# Patient Record
Sex: Female | Born: 1960 | Race: Black or African American | Hispanic: No | Marital: Married | Smoking: Former smoker
Health system: Southern US, Community
[De-identification: ages and names within clinical notes are randomized; demographics above are authoritative.]

## PROBLEM LIST (undated history)

## (undated) DIAGNOSIS — I10 Essential (primary) hypertension: Secondary | ICD-10-CM

## (undated) HISTORY — PX: BUNIONECTOMY: SHX129

## (undated) HISTORY — PX: TUBAL LIGATION: SHX77

## (undated) HISTORY — PX: ABDOMINAL HYSTERECTOMY: SHX81

---

## 2006-08-27 ENCOUNTER — Other Ambulatory Visit: Admission: RE | Admit: 2006-08-27 | Discharge: 2006-08-27 | Payer: Self-pay | Admitting: Family Medicine

## 2007-11-18 ENCOUNTER — Ambulatory Visit (HOSPITAL_COMMUNITY): Admission: RE | Admit: 2007-11-18 | Discharge: 2007-11-19 | Payer: Self-pay | Admitting: Obstetrics and Gynecology

## 2007-11-18 ENCOUNTER — Encounter (INDEPENDENT_AMBULATORY_CARE_PROVIDER_SITE_OTHER): Payer: Self-pay | Admitting: Obstetrics and Gynecology

## 2010-09-05 NOTE — Op Note (Signed)
NAMECRYSTINA, Kara Braun               ACCOUNT NO.:  [id number]   MEDICAL RECORD NO.:  [id number]          PATIENT TYPE:  OIB   LOCATION:  9312                          FACILITY:  [facility]   PHYSICIAN:  Hal Morales, M.D.DATE OF BIRTH:  April 01, 1961   DATE OF PROCEDURE:  11/18/2007  DATE OF DISCHARGE:                               OPERATIVE REPORT   PREOPERATIVE DIAGNOSES:  1. Menorrhagia.  2. Uterine fibroids status post tubal ligation.   POSTOPERATIVE DIAGNOSES:  1. Menorrhagia.  2. Uterine fibroids status post tubal ligation.  3. Left hydrosalpinx.   OPERATIONS:  Total vaginal hysterectomy, right partial salpingectomy,  removal of left hydrosalpinx, and McCall culdoplasty.   SURGEON:  Vanessa P. Pennie Rushing, MD   FIRST ASSISTANT:  Janine Limbo, MD   ANESTHESIA:  General orotracheal.   ESTIMATED BLOOD LOSS:  125 mL.   COMPLICATIONS:  None.   FINDINGS:  The patient had a uterus that was approximately 8-week size  with small fibroids.  The right tube was status post tubal  sterilization.  The left tube was status post tubal sterilization but  enlarged to form a hydrosalpinx which was adherent to the left ovary.   PROCEDURE:  The patient was taken to the operating room after  appropriate identification and placed on the operating table.  After the  attainment of adequate general anesthesia, she was placed in the  lithotomy position.  The perineum and vagina were prepped with multiple  layers of Betadine and draped as a sterile field.  A Foley catheter was  inserted into the bladder and connected to straight drainage.  A  weighted speculum was placed in the posterior vagina and Lahey tenaculum  placed on the anterior and posterior surfaces of the cervix.  The  cervicovaginal mucosa was injected with a dilute solution of Pitressin  for 20 mL.  The cervix was circumscribed.  The anterior vaginal mucosa  was bluntly dissected off the anterior cervix and the anterior  peritoneum  entered.  The bladder blade was placed.  The posterior  peritoneum was entered sharply intact.  The uterosacral ligaments on the  right and left side were clamped, cut, and suture ligated with those  sutures being held.  The paracervical tissues were then clamped, cut,  and suture ligated.  Parametrial tissues were clamped, cut, and suture  ligated.  The uterus was inverted and the upper pedicles clamped, cut,  and doubly suture ligated.  The uterus and cervix were removed from the  operative field.  Hemostatic sutures were required near the upper  pedicles on the right and left side for adequate hemostasis.  The  fallopian tube on the right side was grasped with a Babcock clamp and  the mesosalpinx clamped, cut, and suture ligated allowing the portion of  right tube to be removed from the operative field.  The left tube had  formed a hydrosalpinx and was detached along its mesosalpinx with a  Kelly clamp and suture ligation up to the level of the ovary.  Its  connection with the ovary was then clamped, cut, and suture ligated.  The  sutures used on the tubes were 2-0 Vicryl.  All other sutures were 0  Vicryl.  Once hemostasis was noted to be adequate, the McCall  culdoplasty sutures were placed, three in succession by placing a suture  in the posterior cul-de-sac, then through the left uterosacral ligament  incorporating the intervening posterior peritoneum over to the right  uterosacral ligament and then ending in the posterior cul-de-sac through  to the posterior vagina.  Once the three culdoplasty sutures had been  placed, the vaginal angles were created with the sutures that were held  from the uterosacral ligaments placing them through the anterior vaginal  cuff and posterior vaginal cuff and then tying down each angle.  The  remainder of the vaginal cuff was closed in a single layer incorporating  anterior vaginal mucosa, anterior peritoneum, posterior peritoneum, and  posterior  vaginal mucosa in each figure-of-eight suture.  The McCall  culdoplasty sutures were then tied down.  The vagina was packed with 2-  inch plain packing which had been moistened with Estrace cream.  The  patient was then awakened from general anesthesia and taken to the  recovery room in satisfactory condition having tolerated the procedure  well with sponge and instrument counts correct.   SPECIMENS TO PATHOLOGY:  Uterus and cervix, portion of right tube, and  left hydrosalpinx.      Hal Morales, M.D.  Electronically Signed     VPH/MEDQ  D:  11/18/2007  T:  11/19/2007  Job:  [id number]

## 2010-09-08 NOTE — Discharge Summary (Signed)
Kara Braun, Kara Braun               ACCOUNT NO.:  [id number]   MEDICAL RECORD NO.:  [id number]          PATIENT TYPE:  OIB   LOCATION:  9312                          FACILITY:  [facility]   PHYSICIAN:  Hal Morales, M.D.DATE OF BIRTH:  February 12, 1961   DATE OF ADMISSION:  11/18/2007  DATE OF DISCHARGE:  11/19/2007                               DISCHARGE SUMMARY   DISCHARGE DIAGNOSES:  1. Menorrhagia.  2. Uterine fibroids.  3. Adenomyosis.  4. Endometriosis.  5. Hydrosalpinx.  6. Status post bilateral tubal ligation.   OPERATION ON THE DAY OF ADMISSION:  The patient underwent a total  vaginal hysterectomy with a partial right salpingectomy, removal of left  hydrosalpinx and McCall culdoplasty tolerating all procedures well.  The  patient was found to have an 8-week size uterus with small fibroid to be  status post bilateral tubal ligation with a left hydrosalpinx.   HISTORY OF PRESENT ILLNESS:  Kara Braun is a 50 year old married black  female para 2-0-1-1 who presents for definitive therapy of uterine  fibroids associated with menorrhagia and dysmenorrhea.  Please see the  patient's dictated history and physical examination for details.   PREOPERATIVE PHYSICAL EXAM:  VITAL SIGNS:  Blood pressure 120/80, pulse  is 70, height is 5 feet and 6-1/2 inches, and weight is 172.  GENERAL:  Within normal limits.  PELVIC:  EG/BUS was within normal limits.  Vagina was rugose.  Cervix  was without gross lesions.  The uterus was about 8-10 weeks' size,  mobile and nontender.  Adnexa was without masses.  Rectovaginal without  masses.   HOSPITAL COURSE:  On the day of admission, the patient underwent  aforementioned procedures tolerating them all well.  The patient's  postoperative course was marked by transient nausea, which resolved by  postop day #1 and along with the resumption of bowel and bladder  function.  The patient's postop hemoglobin was 11.7 (preop hemoglobin  11.9).  Having received  the maximum benefit of her hospital stay, the  patient was therefore discharged home on postop day #1.   DISCHARGE MEDICATIONS:  The patient was referred to her home medication  reconciliation form.  She was further prescribed Percocet 1-2 tablets  every 4 hours as needed for pain.  Ibuprofen 600 mg with food every 6  hours for 3 days, then as needed for pain.   FOLLOW UP:  The patient is to follow up with Dr. Pennie Rushing in 6 weeks as  scheduled for her postop visit.   DISCHARGE INSTRUCTIONS:  The patient was given a copy of [address]  [facility] postoperative instruction sheet.  She was further  advised to avoid driving for 2 weeks, heavy lifting for 4 weeks,  intercourse for 6 weeks, but she may walk up steps, that she may shower.  Diet was without restriction.   FINAL PATHOLOGY:  1. Uterus hysterectomy:  Benign cervix with squamous metaplasia,      benign proliferative endometrium, leiomyomata, and adenomyosis.  2. Bilateral fallopian tubes resection:  Salpingitis isthmica nodosa,      hydrosalpinx, and a small focus of endometriosis.  Kara Braun.      ______________________________  Hal Morales, M.D.    EJP/MEDQ  D:  12/03/2007  T:  12/04/2007  Job:  [id number]

## 2010-09-08 NOTE — H&P (Signed)
NAME:  Kara Braun, Kara Braun NO.:  [id number]   MEDICAL RECORD NO.:  [id number]        PATIENT TYPE:  WAMB   LOCATION:                                FACILITY:  [facility]   PHYSICIAN:  Hal Morales, M.D.DATE OF BIRTH:  Apr 21, 1961   DATE OF ADMISSION:  11/18/2007  DATE OF DISCHARGE:                              HISTORY & PHYSICAL   HISTORY OF PRESENT ILLNESS:  The patient is a 50 year old black married  female, para 2-0-1-1, who presents for definitive therapy of uterine  fibroids associated with menorrhagia and dysmenorrhea.  The patient has  a history of menorrhagia that dates back to 1999, but it has definitely  worsened over the subsequent years.  She now has menses that occur every  21 days lasting between 7 and 9 days.  She bleeds through her protection  approximately two nights per cycle and needs to change protection up to  four times during the night.  Cramps can start with the onset of  bleeding but some cycles she actually has no cramping.  Her last  menstrual period began on October 17, 2007.  Previous menstrual period had  been 21 days prior.  She denies any intermenstrual bleeding.   She has had anemia in the past but on her most recent blood work had a  normal hemoglobin.   PAST MEDICAL HISTORY:  GYN HISTORY:  She underwent menarche at age 46  and had menses every 21 days.  She currently uses tubal ligation for  contraception which was done in 1999.  She had her most recent Pap smear  on October 23, 2007.  She had an abnormal Pap smear many years ago but it  reverted to normal without therapy.  She does have fibroid tumors and  has known about those since her tubal ligation.   OBSTETRICAL HISTORY:  She has had two normal spontaneous vaginal  deliveries of approximately 6 pound infants.  Her son died at age 58.  She had one spontaneous abortion for which she had a D&C without  complications.   MEDICAL HISTORY:  Is significant for psoriatic migraine  headaches,  anemia, chronic hypertension and recently diagnosed asthma with severe  allergies.   FAMILY HISTORY:  Is significant for chronic hypertension and heart  disease.   SURGICAL HISTORY:  The patient has undergone two bunionectomies and a  tubal ligation, all under general anesthesia without difficulty.   CURRENT MEDICATIONS:  1. Atenolol 100 mg a day.  2. Micardis 40 mg a day.  3. Astelin nasal spray.  4. Xopenex inhaler.  5. Singulair 4 mg a day.  6. Symbicort inhaler.  7. Allergy shots weekly.  8. Vitamins and calcium with vitamin D.   DRUG SENSITIVITIES:  PENICILLIN causes swelling   SOCIAL HISTORY:  The patient is married and works as a patient Investment banker, operational.  She drinks two to three glasses of wine per week.  She  does not smoke cigarettes.   LABORATORY STUDIES:  She has undergone endometrial biopsy and pelvic  ultrasound, the results of which are pending at the time of this  dictation.   REVIEW OF SYSTEMS:  Is significant for severe allergic reactive airway  disease with multiple allergies.   PHYSICAL EXAMINATION:  The patient is a well-developed black female in  no acute distress.  Blood pressure is 120/80, pulse is 70, height 5 feet  6-1/2 inches, weight 172 pounds.  LUNGS:  Clear.  HEART: Regular rate and rhythm.  Thyroid normal.  ABDOMEN: Abdomen is benign without masses or organomegaly.  EXTREMITIES: No clubbing, cyanosis or edema.  PELVIC:  EG/BUS within normal limits.  The vagina is rugous.  The cervix  is without gross lesions.  The uterus is about 8 to 10-week size, mobile  and nontender.  Adnexae no masses.  Rectovaginal no masses.   IMPRESSION:  1. Uterine fibroids.  2. Dysmenorrhea.  3. Menorrhagia.  4. Reactive airway disease.  5. Hypertension, well controlled.   DISPOSITION:  Discussions have been held with the patient concerning  options for management of her fibroids associated with dysmenorrhea and  menorrhagia.  The  patient has decided she wants to proceed with  definitive therapy of hysterectomy.  The risks of anesthesia,  particularly in the presence of asthma, bleeding, infection and damage  to adjacent organs as well as the possible need for transfusion have all  been discussed with the patient.  She wishes to proceed.  This will be  done at [facility] on November 18, 2007.      Hal Morales, M.D.  Electronically Signed     VPH/MEDQ  D:  10/23/2007  T:  10/23/2007  Job:  [id number]

## 2010-09-15 ENCOUNTER — Other Ambulatory Visit: Payer: Self-pay | Admitting: Family Medicine

## 2010-09-15 ENCOUNTER — Other Ambulatory Visit (HOSPITAL_COMMUNITY)
Admission: RE | Admit: 2010-09-15 | Discharge: 2010-09-15 | Disposition: A | Discharge status: Home / Self Care | Payer: BC Managed Care – PPO | Source: Ambulatory Visit | Attending: Family Medicine | Admitting: Family Medicine

## 2010-09-15 DIAGNOSIS — Z124 Encounter for screening for malignant neoplasm of cervix: Secondary | ICD-10-CM | POA: Insufficient documentation

## 2010-09-15 DIAGNOSIS — Z1159 Encounter for screening for other viral diseases: Secondary | ICD-10-CM | POA: Insufficient documentation

## 2011-01-19 LAB — CBC
HCT: 35.5 — ABNORMAL LOW
HCT: 36.1
Hemoglobin: 11.7 — ABNORMAL LOW
Hemoglobin: 11.9 — ABNORMAL LOW
MCHC: 32.8
MCHC: 33.1
MCV: 91.4
MCV: 92.7
Platelets: 340
Platelets: 391
RBC: 3.83 — ABNORMAL LOW
RBC: 3.95
RDW: 13.8
RDW: 14.1
WBC: 13.9 — ABNORMAL HIGH
WBC: 6.2

## 2011-01-19 LAB — BASIC METABOLIC PANEL
BUN: 5 — ABNORMAL LOW
CO2: 30
Calcium: 9.1
Chloride: 102
Creatinine, Ser: 0.73
GFR calc Af Amer: >60
GFR calc non Af Amer: >60
Glucose, Bld: 112 — ABNORMAL HIGH
Potassium: 4.2
Sodium: 136

## 2011-06-15 ENCOUNTER — Other Ambulatory Visit: Payer: Self-pay | Admitting: Family Medicine

## 2011-06-15 ENCOUNTER — Ambulatory Visit
Admission: RE | Admit: 2011-06-15 | Discharge: 2011-06-15 | Disposition: A | Discharge status: Home / Self Care | Payer: BC Managed Care – PPO | Source: Ambulatory Visit | Attending: Family Medicine | Admitting: Family Medicine

## 2011-06-15 DIAGNOSIS — M545 Low back pain, unspecified: Secondary | ICD-10-CM

## 2012-03-05 ENCOUNTER — Other Ambulatory Visit: Payer: Self-pay | Admitting: *Deleted

## 2012-03-05 DIAGNOSIS — I83893 Varicose veins of bilateral lower extremities with other complications: Secondary | ICD-10-CM

## 2012-03-10 ENCOUNTER — Encounter: Payer: Self-pay | Admitting: Vascular Surgery

## 2012-03-11 ENCOUNTER — Encounter: Payer: Self-pay | Admitting: Vascular Surgery

## 2012-03-11 ENCOUNTER — Ambulatory Visit (INDEPENDENT_AMBULATORY_CARE_PROVIDER_SITE_OTHER): Payer: BC Managed Care – PPO | Admitting: Vascular Surgery

## 2012-03-11 VITALS — BP 151/84 | HR 60 | Resp 16 | Ht 66.0 in | Wt 180.0 lb

## 2012-03-11 DIAGNOSIS — I8390 Asymptomatic varicose veins of unspecified lower extremity: Secondary | ICD-10-CM | POA: Insufficient documentation

## 2012-03-11 DIAGNOSIS — I83893 Varicose veins of bilateral lower extremities with other complications: Secondary | ICD-10-CM

## 2012-03-11 DIAGNOSIS — M7989 Other specified soft tissue disorders: Secondary | ICD-10-CM

## 2012-03-11 DIAGNOSIS — R609 Edema, unspecified: Secondary | ICD-10-CM

## 2012-03-11 NOTE — Progress Notes (Signed)
Subjective:     Patient ID: Kara Braun, female   DOB: 23-Nov-1960, 51 y.o.   MRN: [id number]  HPI this 51 year-old female is evaluated for bilateral lower extremity edema present since July 2013. She has no history of trauma, DVT, superficial thrombophlebitis, Ultram varicose veins, stasis ulcers, or bleeding. She is not wear elastic compression stockings. She does take a diuretic-Lasix with her antihypertensive regimen. She does not elevate the legs. They become swollen as the day progresses and cause some aching discomfort.  History reviewed. No pertinent past medical history.  History  Substance Use Topics  . Smoking status: Former Smoker    Quit date: 05/12/1995  . Smokeless tobacco: Not on file  . Alcohol Use: 1.8 oz/week    3 Glasses of wine per week    Family History  Problem Relation Age of Onset  . Hypertension Mother   . Hypertension Father   . Heart disease Father   . Hypertension Sister   . Hypertension Brother     Allergies  Allergen Reactions  . Penicillins     Current outpatient prescriptions:azelastine (ASTELIN) 137 MCG/SPRAY nasal spray, Place 1 spray into the nose 2 (two) times daily. Use in each nostril as directed, Disp: , Rfl: ;  cetirizine (ZYRTEC) 10 MG tablet, Take 10 mg by mouth daily., Disp: , Rfl: ;  furosemide (LASIX) 20 MG tablet, Take 20 mg by mouth 2 (two) times daily., Disp: , Rfl: ;  traMADol (ULTRAM) 50 MG tablet, Take 50 mg by mouth every 6 (six) hours as needed., Disp: , Rfl:  ALPRAZolam (XANAX) 0.25 MG tablet, , Disp: , Rfl: ;  atenolol (TENORMIN) 100 MG tablet, , Disp: , Rfl: ;  losartan (COZAAR) 50 MG tablet, , Disp: , Rfl:   BP 151/84  Pulse 60  Resp 16  Ht 5\' 6"  (1.676 m)  Wt 180 lb (81.647 kg)  BMI 29.05 kg/m2  Body mass index is 29.05 kg/(m^2).          Review of Systems denies chest pain, dyspnea on exertion, PND, orthopnea, hemoptysis, lateralizing weakness, aphasia, amaurosis fugax, syncope.    Objective:   Physical  Exam blood pressure 151/84 heart rate 60 respirations 16 Gen.-alert and oriented x3 in no apparent distress HEENT normal for age Lungs no rhonchi or wheezing Cardiovascular regular rhythm no murmurs carotid pulses 3+ palpable no bruits audible Abdomen soft nontender no palpable masses Musculoskeletal free of  major deformities Skin clear -no rashes Neurologic normal Lower extremities 3+ femoral and dorsalis pedis pulses palpable bilaterally with no edema presently Patch of spider and small reticular veins anterolateral thigh the left leg over about a 3 cm in diameter area. No hyperpigmentation or ulceration noted.  Today I ordered bilateral venous duplex exam which I reviewed and interpreted. The right leg is totally normal. Left leg has a few areas of mild reflux in the distal left thigh and some reflux in the deep system but no DVT.      Assessment:     Bilateral lower extremity edema-etiology unknown No obvious venous source Mild reflux areas of left superficial venous system    Plan:     Recommend short leg elastic compression stockings during the day and elevation of legs at night and continue diuretic therapy

## 2012-03-11 NOTE — Progress Notes (Signed)
Bilateral lower extremity venous duplex performed @ [facility]11/19/2013

## 2012-03-14 ENCOUNTER — Encounter (INDEPENDENT_AMBULATORY_CARE_PROVIDER_SITE_OTHER): Payer: BC Managed Care – PPO

## 2012-03-14 DIAGNOSIS — I83893 Varicose veins of bilateral lower extremities with other complications: Secondary | ICD-10-CM

## 2015-05-23 ENCOUNTER — Other Ambulatory Visit: Payer: Self-pay | Admitting: Radiology

## 2017-03-08 ENCOUNTER — Other Ambulatory Visit: Payer: Self-pay | Admitting: Family Medicine

## 2017-03-08 DIAGNOSIS — R222 Localized swelling, mass and lump, trunk: Secondary | ICD-10-CM

## 2017-03-18 ENCOUNTER — Other Ambulatory Visit: Payer: Self-pay

## 2017-03-19 ENCOUNTER — Ambulatory Visit
Admission: RE | Admit: 2017-03-19 | Discharge: 2017-03-19 | Disposition: A | Discharge status: Home / Self Care | Payer: 59 | Source: Ambulatory Visit | Attending: Family Medicine | Admitting: Family Medicine

## 2017-03-19 DIAGNOSIS — R222 Localized swelling, mass and lump, trunk: Secondary | ICD-10-CM

## 2017-03-26 ENCOUNTER — Other Ambulatory Visit: Payer: Self-pay

## 2017-08-21 ENCOUNTER — Other Ambulatory Visit: Payer: Self-pay | Admitting: Obstetrics and Gynecology

## 2017-08-21 DIAGNOSIS — R1031 Right lower quadrant pain: Secondary | ICD-10-CM

## 2017-08-22 ENCOUNTER — Other Ambulatory Visit: Payer: Self-pay | Admitting: Obstetrics and Gynecology

## 2017-08-22 ENCOUNTER — Ambulatory Visit
Admission: RE | Admit: 2017-08-22 | Discharge: 2017-08-22 | Disposition: A | Discharge status: Home / Self Care | Payer: 59 | Source: Ambulatory Visit | Attending: Obstetrics and Gynecology | Admitting: Obstetrics and Gynecology

## 2017-08-22 DIAGNOSIS — R1031 Right lower quadrant pain: Secondary | ICD-10-CM

## 2017-08-30 ENCOUNTER — Other Ambulatory Visit: Payer: Self-pay | Admitting: Obstetrics and Gynecology

## 2017-08-30 DIAGNOSIS — R1031 Right lower quadrant pain: Secondary | ICD-10-CM

## 2017-10-21 ENCOUNTER — Other Ambulatory Visit: Payer: Self-pay

## 2017-10-21 ENCOUNTER — Emergency Department (HOSPITAL_COMMUNITY)
Admission: EM | Admit: 2017-10-21 | Discharge: 2017-10-21 | Disposition: A | Discharge status: Home / Self Care | Payer: 59 | Attending: Emergency Medicine | Admitting: Emergency Medicine

## 2017-10-21 ENCOUNTER — Emergency Department (HOSPITAL_COMMUNITY): Payer: 59

## 2017-10-21 ENCOUNTER — Encounter (HOSPITAL_COMMUNITY): Payer: Self-pay | Admitting: Emergency Medicine

## 2017-10-21 DIAGNOSIS — I10 Essential (primary) hypertension: Secondary | ICD-10-CM | POA: Diagnosis not present

## 2017-10-21 DIAGNOSIS — Z87891 Personal history of nicotine dependence: Secondary | ICD-10-CM | POA: Insufficient documentation

## 2017-10-21 DIAGNOSIS — Z79899 Other long term (current) drug therapy: Secondary | ICD-10-CM | POA: Insufficient documentation

## 2017-10-21 HISTORY — DX: Essential (primary) hypertension: I10

## 2017-10-21 LAB — CBC WITH DIFFERENTIAL/PLATELET
Basophils Absolute: 0.1 10*3/uL (ref 0.0–0.1)
Basophils Relative: 2 %
Eosinophils Absolute: 0.3 10*3/uL (ref 0.0–0.7)
Eosinophils Relative: 5 %
HCT: 39.6 % (ref 36.0–46.0)
Hemoglobin: 13.1 g/dL (ref 12.0–15.0)
Lymphocytes Relative: 50 %
Lymphs Abs: 3.4 10*3/uL (ref 0.7–4.0)
MCH: 29.8 pg (ref 26.0–34.0)
MCHC: 33.1 g/dL (ref 30.0–36.0)
MCV: 90 fL (ref 78.0–100.0)
Monocytes Absolute: 0.5 10*3/uL (ref 0.1–1.0)
Monocytes Relative: 7 %
Neutro Abs: 2.4 10*3/uL (ref 1.7–7.7)
Neutrophils Relative %: 36 %
Platelets: 359 10*3/uL (ref 150–400)
RBC: 4.4 MIL/uL (ref 3.87–5.11)
RDW: 13.6 % (ref 11.5–15.5)
WBC: 6.7 10*3/uL (ref 4.0–10.5)

## 2017-10-21 LAB — BASIC METABOLIC PANEL
Anion gap: 7 (ref 5–15)
BUN: 16 mg/dL (ref 6–20)
CO2: 27 mmol/L (ref 22–32)
Calcium: 9.3 mg/dL (ref 8.9–10.3)
Chloride: 103 mmol/L (ref 98–111)
Creatinine, Ser: 0.77 mg/dL (ref 0.44–1.00)
GFR calc Af Amer: >60 mL/min (ref >60)
GFR calc non Af Amer: >60 mL/min (ref >60)
Glucose, Bld: 99 mg/dL (ref 70–99)
Potassium: 3.7 mmol/L (ref 3.5–5.1)
Sodium: 137 mmol/L (ref 135–145)

## 2017-10-21 LAB — URINALYSIS, ROUTINE W REFLEX MICROSCOPIC
Bilirubin Urine: NEGATIVE
Glucose, UA: NEGATIVE mg/dL
Hgb urine dipstick: NEGATIVE
Ketones, ur: NEGATIVE mg/dL
Leukocytes, UA: NEGATIVE
Nitrite: NEGATIVE
Protein, ur: NEGATIVE mg/dL
Specific Gravity, Urine: 1.006 (ref 1.005–1.030)
pH: 5 (ref 5.0–8.0)

## 2017-10-21 LAB — I-STAT TROPONIN, ED: Troponin i, poc: 0 ng/mL (ref 0.00–0.08)

## 2017-10-21 NOTE — ED Notes (Signed)
ED Provider at bedside. 

## 2017-10-21 NOTE — ED Notes (Signed)
EKG given to EDP,Nanavati,MD., for review. 

## 2017-10-21 NOTE — Discharge Instructions (Signed)
Your blood pressure is elevated today but not critically high. Check BP twice daily at home, preferably same time each day.  Keep a log for your primary care doctor. Call in the morning to arrange follow-up for re-check. Return here for any new/acute changes.

## 2017-10-21 NOTE — ED Provider Notes (Signed)
[facility]-EMERGENCY DEPT Provider Note   CSN: [id number] Arrival date & time: 10/21/17  0009     History   Chief Complaint Chief Complaint  Patient presents with  . Hypertension    HPI Kara Braun is a 57 y.o. female.  The history is provided by the patient and medical records.    57 year old female with history of hypertension, presenting to the ED with elevated blood pressures.  Patient reports she has noticed today that her blood pressure has been more elevated than normal.  States on Friday she had a little bit of a headache but did not check her blood pressure at that time.  States yesterday she felt fine, but today started having a little bit of a headache again.  States she has checked her blood pressure multiple times today, usually in the 160s to 170s systolic.  States her baseline blood pressure is around 120s/80s.  She has not had any changes in her blood pressure medicine.  She has not had any recent illness.  Has not been taking any over-the-counter cough or cold medications.  She denies any significant salt intake.  No new added stress.  Does report today she felt a little bit "off" and had some transient tingling in her left arm.  She denies any chest pain, shortness of breath, diaphoresis, nausea, or vomiting.  Has been able to eat and drink well today.  States she has been on the same blood pressure medications for several years, no adjustments to her dosages.  No known cardiac history.  She is not a smoker.  Past Medical History:  Diagnosis Date  . Hypertension     Patient Active Problem List   Diagnosis Date Noted  . Varicose veins 03/11/2012  . Swelling of limb 03/11/2012    Past Surgical History:  Procedure Laterality Date  . ABDOMINAL HYSTERECTOMY    . BUNIONECTOMY    . TUBAL LIGATION       OB History   None      Home Medications    Prior to Admission medications   Medication Sig Start Date End Date Taking? Authorizing  Provider  ALPRAZolam Prudy Feeler) 0.25 MG tablet  03/04/12   [provider]  atenolol (TENORMIN) 100 MG tablet  01/11/12   [provider]  azelastine (ASTELIN) 137 MCG/SPRAY nasal spray Place 1 spray into the nose 2 (two) times daily. Use in each nostril as directed    [provider]  cetirizine (ZYRTEC) 10 MG tablet Take 10 mg by mouth daily.    [provider]  furosemide (LASIX) 20 MG tablet Take 20 mg by mouth 2 (two) times daily.    [provider]  losartan (COZAAR) 50 MG tablet  03/08/12   [provider]  traMADol (ULTRAM) 50 MG tablet Take 50 mg by mouth every 6 (six) hours as needed.    [provider]    Family History Family History  Problem Relation Age of Onset  . Hypertension Mother   . Hypertension Father   . Heart disease Father   . Hypertension Sister   . Hypertension Brother     Social History Social History   Tobacco Use  . Smoking status: Former Smoker    Last attempt to quit: 05/12/1995    Years since quitting: 22.4  . Smokeless tobacco: Never Used  Substance Use Topics  . Alcohol use: Yes    Alcohol/week: 1.8 oz    Types: 3 Glasses of wine per  week  . Drug use: No     Allergies   Penicillins   Review of Systems Review of Systems  Constitutional:       Elevated BP  All other systems reviewed and are negative.    Physical Exam Updated Vital Signs BP (!) 174/89 (BP Location: Right Arm)   Pulse (!) 58   Temp 97.7 F (36.5 C) (Oral)   Resp 18   Ht 5\' 6"  (1.676 m)   Wt 75.3 kg (166 lb)   SpO2 100%   BMI 26.79 kg/m   Physical Exam  Constitutional: She is oriented to person, place, and time. She appears well-developed and well-nourished. No distress.  HENT:  Head: Normocephalic and atraumatic.  Right Ear: External ear normal.  Left Ear: External ear normal.  Mouth/Throat: Oropharynx is clear and moist.  Eyes: Pupils are equal, round, and reactive to light. Conjunctivae and EOM  are normal.  Neck: Normal range of motion and full passive range of motion without pain. Neck supple. No neck rigidity.  No rigidity, no meningismus  Cardiovascular: Normal rate, regular rhythm and normal heart sounds.  No murmur heard. Pulmonary/Chest: Effort normal and breath sounds normal. No respiratory distress. She has no wheezes. She has no rhonchi.  Abdominal: Soft. Bowel sounds are normal. There is no tenderness. There is no guarding.  Musculoskeletal: Normal range of motion. She exhibits no edema.  Neurological: She is alert and oriented to person, place, and time. She has normal strength. She displays no tremor. No cranial nerve deficit or sensory deficit. She displays no seizure activity.  AAOx3, answering questions and following commands appropriately; equal strength UE and LE bilaterally; CN grossly intact; moves all extremities appropriately without ataxia; no focal neuro deficits or facial asymmetry appreciated  Skin: Skin is warm and dry. No rash noted. She is not diaphoretic.  Psychiatric: She has a normal mood and affect. Her behavior is normal. Thought content normal.  Nursing note and vitals reviewed.    ED Treatments / Results  Labs (all labs ordered are listed, but only abnormal results are displayed) Labs Reviewed  URINALYSIS, ROUTINE W REFLEX MICROSCOPIC - Abnormal; Notable for the following components:      Result Value   Color, Urine STRAW (*)    All other components within normal limits  CBC WITH DIFFERENTIAL/PLATELET  BASIC METABOLIC PANEL  I-STAT TROPONIN, ED    EKG EKG Interpretation  Date/Time:  Monday October 21 2017 02:56:05 EDT Ventricular Rate:  47 PR Interval:    QRS Duration: 81 QT Interval:  439 QTC Calculation: 389 R Axis:   9 Text Interpretation:  Sinus bradycardia Probable left atrial enlargement Anteroseptal infarct, age indeterminate No acute changes Confirmed by Derwood Kaplananavati, Ankit [phone]([id number]) on 10/21/2017 4:33:56 AM   Radiology Dg Chest 2  View  Result Date: 10/21/2017 CLINICAL DATA:  57 year old female with hypertension. EXAM: CHEST - 2 VIEW COMPARISON:  Chest radiograph dated 10/30/2006 FINDINGS: Mild emphysema and chronic bronchitic changes. No focal consolidation, pleural effusion, or pneumothorax. The cardiac silhouette is within normal limits. No acute osseous pathology. IMPRESSION: No active cardiopulmonary disease. Electronically Signed   By: Elgie CollardArash  Radparvar M.D.   On: 10/21/2017 03:35    Procedures Procedures (including critical care time)  Medications Ordered in ED Medications - No data to display   Initial Impression / Assessment and Plan / ED Course  I have reviewed the triage vital signs and the nursing notes.  Pertinent labs & imaging results that were available during my care  of the patient were reviewed by me and considered in my medical decision making (see chart for details).  57 year old female presenting to the ED with elevated blood pressure.  States she has had a little bit of a headache and transient "tingling", both of which resolved at this time.  No chest pain, SOB.  Blood pressures here in the 170s systolic, states he is usually in the 120 systolic.  Has been compliant with her medications.  No new stress, over-the-counter cough/cold medications, or other issues to cause elevated BP.  She is awake, alert, appropriately oriented.  She has no focal neurologic deficits.  No complaint of chest pain or shortness of breath.  EKG is nonischemic.  Labs and troponin sent which were all reassuring.  Blood pressure has started to decrease here without any acute intervention, now 153/85.  She has no signs of endorgan damage.  We will have her keep a log of her blood pressures at home over the next few days, check twice daily at the same time each day.  Recommended close follow-up with PCP for recheck of her blood pressure.  She understands to return here for any new or acute changes.  Final Clinical Impressions(s) /  ED Diagnoses   Final diagnoses:  Essential hypertension    ED Discharge Orders    None       Garlon Hatchet, PA-C 10/21/17 [id number]    Derwood Kaplan, MD 10/29/17 1705

## 2017-10-21 NOTE — ED Triage Notes (Signed)
Pt reports having elevated blood pressure that has not decreased with blood pressure medications.

## 2018-06-08 ENCOUNTER — Other Ambulatory Visit: Payer: Self-pay

## 2018-06-08 ENCOUNTER — Observation Stay (HOSPITAL_COMMUNITY)
Admission: EM | Admit: 2018-06-08 | Discharge: 2018-06-09 | Disposition: A | Discharge status: Home / Self Care | Payer: 59 | Attending: Surgery | Admitting: Surgery

## 2018-06-08 ENCOUNTER — Emergency Department (HOSPITAL_COMMUNITY): Payer: 59

## 2018-06-08 ENCOUNTER — Encounter (HOSPITAL_COMMUNITY): Payer: Self-pay | Admitting: Emergency Medicine

## 2018-06-08 DIAGNOSIS — K439 Ventral hernia without obstruction or gangrene: Secondary | ICD-10-CM | POA: Diagnosis not present

## 2018-06-08 DIAGNOSIS — I10 Essential (primary) hypertension: Secondary | ICD-10-CM | POA: Diagnosis not present

## 2018-06-08 DIAGNOSIS — Z880 Allergy status to penicillin: Secondary | ICD-10-CM | POA: Insufficient documentation

## 2018-06-08 DIAGNOSIS — Z79899 Other long term (current) drug therapy: Secondary | ICD-10-CM | POA: Diagnosis not present

## 2018-06-08 DIAGNOSIS — Z791 Long term (current) use of non-steroidal anti-inflammatories (NSAID): Secondary | ICD-10-CM | POA: Insufficient documentation

## 2018-06-08 DIAGNOSIS — R1033 Periumbilical pain: Secondary | ICD-10-CM | POA: Diagnosis present

## 2018-06-08 DIAGNOSIS — Z87891 Personal history of nicotine dependence: Secondary | ICD-10-CM | POA: Insufficient documentation

## 2018-06-08 LAB — CBC WITH DIFFERENTIAL/PLATELET
Abs Immature Granulocytes: 0 10*3/uL (ref 0.00–0.07)
Basophils Absolute: 0.1 10*3/uL (ref 0.0–0.1)
Basophils Relative: 2 %
Eosinophils Absolute: 0.1 10*3/uL (ref 0.0–0.5)
Eosinophils Relative: 3 %
HCT: 41.7 % (ref 36.0–46.0)
Hemoglobin: 13.1 g/dL (ref 12.0–15.0)
Immature Granulocytes: 0 %
Lymphocytes Relative: 33 %
Lymphs Abs: 1.4 10*3/uL (ref 0.7–4.0)
MCH: 28.7 pg (ref 26.0–34.0)
MCHC: 31.4 g/dL (ref 30.0–36.0)
MCV: 91.2 fL (ref 80.0–100.0)
Monocytes Absolute: 0.6 10*3/uL (ref 0.1–1.0)
Monocytes Relative: 15 %
Neutro Abs: 2 10*3/uL (ref 1.7–7.7)
Neutrophils Relative %: 47 %
Platelets: 331 10*3/uL (ref 150–400)
RBC: 4.57 MIL/uL (ref 3.87–5.11)
RDW: 13.7 % (ref 11.5–15.5)
WBC: 4.2 10*3/uL (ref 4.0–10.5)
nRBC: 0 % (ref 0.0–0.2)

## 2018-06-08 LAB — COMPREHENSIVE METABOLIC PANEL
ALT: 22 U/L (ref 0–44)
AST: 23 U/L (ref 15–41)
Albumin: 3.7 g/dL (ref 3.5–5.0)
Alkaline Phosphatase: 75 U/L (ref 38–126)
Anion gap: 6 (ref 5–15)
BUN: <5 mg/dL — ABNORMAL LOW (ref 6–20)
CO2: 26 mmol/L (ref 22–32)
Calcium: 9.5 mg/dL (ref 8.9–10.3)
Chloride: 103 mmol/L (ref 98–111)
Creatinine, Ser: 0.82 mg/dL (ref 0.44–1.00)
GFR calc Af Amer: >60 mL/min (ref >60)
GFR calc non Af Amer: >60 mL/min (ref >60)
Glucose, Bld: 115 mg/dL — ABNORMAL HIGH (ref 70–99)
Potassium: 3.9 mmol/L (ref 3.5–5.1)
Sodium: 135 mmol/L (ref 135–145)
Total Bilirubin: 0.8 mg/dL (ref 0.3–1.2)
Total Protein: 6.9 g/dL (ref 6.5–8.1)

## 2018-06-08 LAB — LIPASE, BLOOD: Lipase: 28 U/L (ref 11–51)

## 2018-06-08 LAB — URINALYSIS, ROUTINE W REFLEX MICROSCOPIC
Bilirubin Urine: NEGATIVE
Glucose, UA: NEGATIVE mg/dL
Hgb urine dipstick: NEGATIVE
Ketones, ur: NEGATIVE mg/dL
Leukocytes,Ua: NEGATIVE
Nitrite: NEGATIVE
Protein, ur: NEGATIVE mg/dL
Specific Gravity, Urine: 1.003 — ABNORMAL LOW (ref 1.005–1.030)
pH: 8 (ref 5.0–8.0)

## 2018-06-08 LAB — LACTIC ACID, PLASMA: Lactic Acid, Venous: 1.1 mmol/L (ref 0.5–1.9)

## 2018-06-08 MED ORDER — ENOXAPARIN SODIUM 40 MG/0.4ML ~~LOC~~ SOLN
40.0000 mg | SUBCUTANEOUS | Status: DC
  Administered 2018-06-08: 40 mg via SUBCUTANEOUS
  Filled 2018-06-08: qty 0.4

## 2018-06-08 MED ORDER — LORATADINE 10 MG PO TABS
10.0000 mg | ORAL_TABLET | Freq: Every evening | ORAL | Status: DC
  Administered 2018-06-08: 10 mg via ORAL
  Filled 2018-06-08: qty 1

## 2018-06-08 MED ORDER — HYDROMORPHONE HCL 1 MG/ML IJ SOLN
1.0000 mg | Freq: Once | INTRAMUSCULAR | Status: AC
  Administered 2018-06-08: 1 mg via INTRAVENOUS
  Filled 2018-06-08: qty 1

## 2018-06-08 MED ORDER — DIPHENHYDRAMINE HCL 12.5 MG/5ML PO ELIX
12.5000 mg | ORAL_SOLUTION | Freq: Four times a day (QID) | ORAL | Status: DC | PRN
  Administered 2018-06-09: 12.5 mg via ORAL
  Filled 2018-06-08: qty 10

## 2018-06-08 MED ORDER — ACETAMINOPHEN 500 MG PO TABS
1000.0000 mg | ORAL_TABLET | Freq: Four times a day (QID) | ORAL | Status: DC
  Administered 2018-06-08 – 2018-06-09 (×3): 1000 mg via ORAL
  Filled 2018-06-08 (×4): qty 2

## 2018-06-08 MED ORDER — BENZONATATE 100 MG PO CAPS
100.0000 mg | ORAL_CAPSULE | Freq: Once | ORAL | Status: AC
  Administered 2018-06-08: 100 mg via ORAL
  Filled 2018-06-08: qty 1

## 2018-06-08 MED ORDER — DOCUSATE SODIUM 100 MG PO CAPS
200.0000 mg | ORAL_CAPSULE | Freq: Two times a day (BID) | ORAL | Status: DC
  Administered 2018-06-08 – 2018-06-09 (×2): 200 mg via ORAL
  Filled 2018-06-08 (×3): qty 2

## 2018-06-08 MED ORDER — LACTATED RINGERS IV SOLN
INTRAVENOUS | Status: DC
  Administered 2018-06-08 – 2018-06-09 (×2): via INTRAVENOUS

## 2018-06-08 MED ORDER — DIPHENHYDRAMINE HCL 50 MG/ML IJ SOLN: 12.5000 mg | Freq: Four times a day (QID) | INTRAMUSCULAR | Status: DC | PRN

## 2018-06-08 MED ORDER — ONDANSETRON HCL 4 MG/2ML IJ SOLN: 4.0000 mg | Freq: Four times a day (QID) | INTRAMUSCULAR | Status: DC | PRN

## 2018-06-08 MED ORDER — LOSARTAN POTASSIUM 50 MG PO TABS
50.0000 mg | ORAL_TABLET | Freq: Every day | ORAL | Status: DC
  Administered 2018-06-08: 50 mg via ORAL
  Filled 2018-06-08: qty 1

## 2018-06-08 MED ORDER — ALPRAZOLAM 0.25 MG PO TABS: 0.2500 mg | ORAL_TABLET | Freq: Every day | ORAL | Status: DC | PRN

## 2018-06-08 MED ORDER — SIMETHICONE 80 MG PO CHEW: 40.0000 mg | CHEWABLE_TABLET | Freq: Four times a day (QID) | ORAL | Status: DC | PRN

## 2018-06-08 MED ORDER — ATENOLOL 50 MG PO TABS
100.0000 mg | ORAL_TABLET | Freq: Every day | ORAL | Status: DC
  Administered 2018-06-09: 100 mg via ORAL
  Filled 2018-06-08: qty 2

## 2018-06-08 MED ORDER — ONDANSETRON 4 MG PO TBDP: 4.0000 mg | ORAL_TABLET | Freq: Four times a day (QID) | ORAL | Status: DC | PRN

## 2018-06-08 MED ORDER — HYDROMORPHONE HCL 1 MG/ML IJ SOLN: 0.5000 mg | INTRAMUSCULAR | Status: DC | PRN

## 2018-06-08 MED ORDER — HYDRALAZINE HCL 20 MG/ML IJ SOLN: 10.0000 mg | INTRAMUSCULAR | Status: DC | PRN

## 2018-06-08 MED ORDER — AMITRIPTYLINE HCL 10 MG PO TABS
10.0000 mg | ORAL_TABLET | Freq: Every evening | ORAL | Status: DC | PRN
  Filled 2018-06-08: qty 1

## 2018-06-08 MED ORDER — IOHEXOL 300 MG/ML  SOLN
100.0000 mL | Freq: Once | INTRAMUSCULAR | Status: AC | PRN
  Administered 2018-06-08: 100 mL via INTRAVENOUS

## 2018-06-08 MED ORDER — TRAMADOL HCL 50 MG PO TABS: 50.0000 mg | ORAL_TABLET | Freq: Four times a day (QID) | ORAL | Status: DC | PRN

## 2018-06-08 MED ORDER — MOMETASONE FURO-FORMOTEROL FUM 100-5 MCG/ACT IN AERO
2.0000 | INHALATION_SPRAY | Freq: Two times a day (BID) | RESPIRATORY_TRACT | Status: DC
  Filled 2018-06-08: qty 8.8

## 2018-06-08 MED ORDER — IBUPROFEN 200 MG PO TABS: 600.0000 mg | ORAL_TABLET | Freq: Four times a day (QID) | ORAL | Status: DC | PRN

## 2018-06-08 NOTE — Progress Notes (Signed)
Pt refused tele monitoring states she does not need it. Pt educated and she is still refusing until a MD explains to her the reasons she needs the tele monitoring.

## 2018-06-08 NOTE — ED Triage Notes (Signed)
Pt in from UC with c/o R and midcentral abd pain, sharp in nature since 1000. Went to UC for cold s/s and doc told her to get eval'd in ED if abd pain no better. Denies fevers, sob, n/v/d or cp. Pain worse when palpated

## 2018-06-08 NOTE — ED Notes (Signed)
Surgery at bedside.

## 2018-06-08 NOTE — H&P (Signed)
CC: Consult by ED-P Dr. Rush Landmark for recently reduced ventral hernia  HPI: Kara Braun is an 58 y.o. female with hx of HTN whom presented to the ED today with acute onset on abdominal pain - supraumbilical. Starting 2/13 she developed a "cold" with coughing. Late morning after church she noted acute onset of sharp supraumbilical pain. Did not radiate. Persistent pain and nothing made it better or worse. She had some nausea but denies emesis. She went to an urgent care and was told she may have a hernia. She subsequently was told to go to the ER and she came here. In the ED, by report she had a supraumbilical hernia that was gently massaged and subsequently felt a pop and it appeared to reduce. She reported dramatic improvement in her symptoms following. She underwent CT which demonstrated a now fat containing supraumbilical hernia with a knuckle of bowel nearby that is mildly hyperemic; no pneumatosis or signs of perforation.  PSH: -BTL via infraumbilical incision -prior transvaginal hysterectomy - no abdominal incisions for this  Past Medical History:  Diagnosis Date  . Hypertension     Past Surgical History:  Procedure Laterality Date  . ABDOMINAL HYSTERECTOMY    . BUNIONECTOMY    . TUBAL LIGATION      Family History  Problem Relation Age of Onset  . Hypertension Mother   . Hypertension Father   . Heart disease Father   . Hypertension Sister   . Hypertension Brother     Social:  reports that she quit smoking about 23 years ago. She has never used smokeless tobacco. She reports current alcohol use of about 3.0 standard drinks of alcohol per week. She reports that she does not use drugs.  Allergies:  Allergies  Allergen Reactions  . Ace Inhibitors Cough  . Penicillins Swelling    Medications: I have reviewed the patient's current medications.  Results for orders placed or performed during the hospital encounter of 06/08/18 (from the past 48 hour(s))  Urinalysis, Routine w  reflex microscopic     Status: Abnormal   Collection Time: 06/08/18 12:50 PM  Result Value Ref Range   Color, Urine STRAW (A) YELLOW   APPearance HAZY (A) CLEAR   Specific Gravity, Urine 1.003 (L) 1.005 - 1.030   pH 8.0 5.0 - 8.0   Glucose, UA NEGATIVE NEGATIVE mg/dL   Hgb urine dipstick NEGATIVE NEGATIVE   Bilirubin Urine NEGATIVE NEGATIVE   Ketones, ur NEGATIVE NEGATIVE mg/dL   Protein, ur NEGATIVE NEGATIVE mg/dL   Nitrite NEGATIVE NEGATIVE   Leukocytes,Ua NEGATIVE NEGATIVE    Comment: Performed at [facility], [address], [place], [place] [address]  CBC with Differential     Status: None   Collection Time: 06/08/18  1:03 PM  Result Value Ref Range   WBC 4.2 4.0 - 10.5 K/uL   RBC 4.57 3.87 - 5.11 MIL/uL   Hemoglobin 13.1 12.0 - 15.0 g/dL   HCT 88.2 80.0 - 34.9 %   MCV 91.2 80.0 - 100.0 fL   MCH 28.7 26.0 - 34.0 pg   MCHC 31.4 30.0 - 36.0 g/dL   RDW 17.9 15.0 - 56.9 %   Platelets 331 150 - 400 K/uL   nRBC 0.0 0.0 - 0.2 %   Neutrophils Relative % 47 %   Neutro Abs 2.0 1.7 - 7.7 K/uL   Lymphocytes Relative 33 %   Lymphs Abs 1.4 0.7 - 4.0 K/uL   Monocytes Relative 15 %   Monocytes Absolute  0.6 0.1 - 1.0 K/uL   Eosinophils Relative 3 %   Eosinophils Absolute 0.1 0.0 - 0.5 K/uL   Basophils Relative 2 %   Basophils Absolute 0.1 0.0 - 0.1 K/uL   Immature Granulocytes 0 %   Abs Immature Granulocytes 0.00 0.00 - 0.07 K/uL    Comment: Performed at [facility], [address] [place], [place] [address]  Comprehensive metabolic panel     Status: Abnormal   Collection Time: 06/08/18  1:03 PM  Result Value Ref Range   Sodium 135 135 - 145 mmol/L   Potassium 3.9 3.5 - 5.1 mmol/L   Chloride 103 98 - 111 mmol/L   CO2 26 22 - 32 mmol/L   Glucose, Bld 115 (H) 70 - 99 mg/dL   BUN <5 (L) 6 - 20 mg/dL   Creatinine, Ser 6.210.82 0.44 - 1.00 mg/dL   Calcium 9.5 8.9 - 30.810.3 mg/dL   Total Protein 6.9 6.5 - 8.1 g/dL   Albumin 3.7 3.5 - 5.0 g/dL   AST 23 15 - 41 U/L   ALT 22  0 - 44 U/L   Alkaline Phosphatase 75 38 - 126 U/L   Total Bilirubin 0.8 0.3 - 1.2 mg/dL   GFR calc non Af Amer >60 >60 mL/min   GFR calc Af Amer >60 >60 mL/min   Anion gap 6 5 - 15    Comment: Performed at [facility], [address] [place], [place] [address]  Lipase, blood     Status: None   Collection Time: 06/08/18  1:03 PM  Result Value Ref Range   Lipase 28 11 - 51 U/L    Comment: Performed at [facility], [address] [place], [place] [address]  Lactic acid, plasma     Status: None   Collection Time: 06/08/18  1:03 PM  Result Value Ref Range   Lactic Acid, Venous 1.1 0.5 - 1.9 mmol/L    Comment: Performed at [facility], [address] [place], [place] [address]    Ct Abdomen Pelvis W Contrast  Result Date: 06/08/2018 CLINICAL DATA:  Severe abdominal pain. Recent reduction of incarcerated hernia. EXAM: CT ABDOMEN AND PELVIS WITH CONTRAST TECHNIQUE: Multidetector CT imaging of the abdomen and pelvis was performed using the standard protocol following bolus administration of intravenous contrast. CONTRAST:  100mL OMNIPAQUE IOHEXOL 300 MG/ML  SOLN COMPARISON:  None. FINDINGS: Lower chest: No acute abnormality. Hepatobiliary: There is a mass in the right hepatic lobe with peripheral discontinuous nodular enhancement, consistent with a hemangioma. This does not completely fill in on delayed images. The hemangioma measures at least 4.6 cm. The liver, gallbladder, and portal vein are otherwise normal. Pancreas: There is a 6 mm cyst in the tail the pancreas on coronal image 87. The pancreas is otherwise normal. Spleen: Normal in size without focal abnormality. Adrenals/Urinary Tract: The adrenal glands are normal. Probable tiny cyst in the anterior right kidney on axial image 25. Large cyst in the left kidney measuring 7 cm. Kidneys are otherwise normal. No ureterectasis or ureteral stones. The bladder is normal. Stomach/Bowel: The stomach is unremarkable. A loop of  small bowel just deep to the anterior abdominal wall and inferior to a ventral hernia is mildly hyperemic with no pneumatosis. By report, an incarcerated hernia was manually reduced and I suspect cysts this loop of bowel was the incarcerated loop. Just proximal to this loop, the jejunum is mildly dilated and contains fecal material, likely recently  obstructed small bowel. The colon is unremarkable. The appendix is normal. Vascular/Lymphatic: No significant vascular findings are present. No enlarged abdominal or pelvic lymph nodes. Reproductive: Status post hysterectomy. No adnexal masses. Other: There is a fat containing umbilical hernia. More superiorly, there is a ventral hernia containing omentum. Musculoskeletal: No acute or significant osseous findings. IMPRESSION: 1. There is a mildly hyperemic segment of small bowel just deep to the anterior abdominal wall and inferior to a ventral hernia. By report, an incarcerated hernia was manually reduced recently and this hyperemic loop of bowel likely represents the previously entrapped loop. There is no pneumatosis or evidence of perforation. Just proximal to this loop of bowel are dilated loops of jejunum containing fecal material consistent with recent obstruction. 2. Right hepatic lobe mass, consistent with a hemangioma. 3. There is a 6 mm cyst in the pancreatic tail. 4. 7 cm left renal cyst. 5. Fat containing umbilical hernia. Superior to the umbilicus, there is a ventral hernia containing omentum. Electronically Signed   By: Gerome Sam III M.D   On: 06/08/2018 15:48    ROS - all of the below systems have been reviewed with the patient and positives are indicated with bold text General: chills, fever or night sweats Eyes: blurry vision or double vision ENT: epistaxis or sore throat Allergy/Immunology: itchy/watery eyes or nasal congestion Hematologic/Lymphatic: bleeding problems, blood clots or swollen lymph nodes Endocrine: temperature intolerance  or unexpected weight changes Breast: new or changing breast lumps or nipple discharge Resp: cough, shortness of breath, or wheezing CV: chest pain or dyspnea on exertion GI: as per HPI GU: dysuria, trouble voiding, or hematuria MSK: joint pain or joint stiffness Neuro: TIA or stroke symptoms Derm: pruritus and skin lesion changes Psych: anxiety and depression  PE Blood pressure (!) 143/72, pulse (!) 56, temperature 97.9 F (36.6 C), temperature source Oral, resp. rate 18, weight 75.3 kg, SpO2 98 %. Constitutional: NAD; conversant; no deformities Eyes: Moist conjunctiva; no lid lag; anicteric; PERRL Neck: Trachea midline; no thyromegaly Lungs: Normal respiratory effort; no tactile fremitus CV: RRR; no palpable thrills; no pitting edema GI: Abd soft, reducible small supraumbilical hernia which is soft, no overlying skin changes, no tenderness. Abdomen is nontender and nondistended. No rebound. No guarding. No palpable hepatosplenomegaly MSK: Normal gait; no clubbing/cyanosis Psychiatric: Appropriate affect; alert and oriented x3 Lymphatic: No palpable cervical or axillary lymphadenopathy  Results for orders placed or performed during the hospital encounter of 06/08/18 (from the past 48 hour(s))  Urinalysis, Routine w reflex microscopic     Status: Abnormal   Collection Time: 06/08/18 12:50 PM  Result Value Ref Range   Color, Urine STRAW (A) YELLOW   APPearance HAZY (A) CLEAR   Specific Gravity, Urine 1.003 (L) 1.005 - 1.030   pH 8.0 5.0 - 8.0   Glucose, UA NEGATIVE NEGATIVE mg/dL   Hgb urine dipstick NEGATIVE NEGATIVE   Bilirubin Urine NEGATIVE NEGATIVE   Ketones, ur NEGATIVE NEGATIVE mg/dL   Protein, ur NEGATIVE NEGATIVE mg/dL   Nitrite NEGATIVE NEGATIVE   Leukocytes,Ua NEGATIVE NEGATIVE    Comment: Performed at [facility], [address], [place], [place] [address]  CBC with Differential     Status: None   Collection Time: 06/08/18  1:03 PM  Result Value Ref Range    WBC 4.2 4.0 - 10.5 K/uL   RBC 4.57 3.87 - 5.11 MIL/uL   Hemoglobin 13.1 12.0 - 15.0 g/dL   HCT 79.0 24.0 - 97.3 %   MCV  91.2 80.0 - 100.0 fL   MCH 28.7 26.0 - 34.0 pg   MCHC 31.4 30.0 - 36.0 g/dL   RDW 09.8 11.9 - 14.7 %   Platelets 331 150 - 400 K/uL   nRBC 0.0 0.0 - 0.2 %   Neutrophils Relative % 47 %   Neutro Abs 2.0 1.7 - 7.7 K/uL   Lymphocytes Relative 33 %   Lymphs Abs 1.4 0.7 - 4.0 K/uL   Monocytes Relative 15 %   Monocytes Absolute 0.6 0.1 - 1.0 K/uL   Eosinophils Relative 3 %   Eosinophils Absolute 0.1 0.0 - 0.5 K/uL   Basophils Relative 2 %   Basophils Absolute 0.1 0.0 - 0.1 K/uL   Immature Granulocytes 0 %   Abs Immature Granulocytes 0.00 0.00 - 0.07 K/uL    Comment: Performed at [facility], [address], [place], [place] [address]  Comprehensive metabolic panel     Status: Abnormal   Collection Time: 06/08/18  1:03 PM  Result Value Ref Range   Sodium 135 135 - 145 mmol/L   Potassium 3.9 3.5 - 5.1 mmol/L   Chloride 103 98 - 111 mmol/L   CO2 26 22 - 32 mmol/L   Glucose, Bld 115 (H) 70 - 99 mg/dL   BUN <5 (L) 6 - 20 mg/dL   Creatinine, Ser 2.13 0.44 - 1.00 mg/dL   Calcium 9.5 8.9 - 08.6 mg/dL   Total Protein 6.9 6.5 - 8.1 g/dL   Albumin 3.7 3.5 - 5.0 g/dL   AST 23 15 - 41 U/L   ALT 22 0 - 44 U/L   Alkaline Phosphatase 75 38 - 126 U/L   Total Bilirubin 0.8 0.3 - 1.2 mg/dL   GFR calc non Af Amer >60 >60 mL/min   GFR calc Af Amer >60 >60 mL/min   Anion gap 6 5 - 15    Comment: Performed at [facility], [address] [place], [place] [address]  Lipase, blood     Status: None   Collection Time: 06/08/18  1:03 PM  Result Value Ref Range   Lipase 28 11 - 51 U/L    Comment: Performed at [facility], [address] [place], [place] [address]  Lactic acid, plasma     Status: None   Collection Time: 06/08/18  1:03 PM  Result Value Ref Range   Lactic Acid, Venous 1.1 0.5 - 1.9 mmol/L    Comment: Performed at [facility], [address] [place], [place] [address]    Ct Abdomen Pelvis W Contrast  Result Date: 06/08/2018 CLINICAL DATA:  Severe abdominal pain. Recent reduction of incarcerated hernia. EXAM: CT ABDOMEN AND PELVIS WITH CONTRAST TECHNIQUE: Multidetector CT imaging of the abdomen and pelvis was performed using the standard protocol following bolus administration of intravenous contrast. CONTRAST:  OMNIPAQUE IOHEXOL 300 MG/ML  SOLN COMPARISON:  None. FINDINGS: Lower chest: No acute abnormality. Hepatobiliary: There is a mass in the right hepatic lobe with peripheral discontinuous nodular enhancement, consistent with a hemangioma. This does not completely fill in on delayed images. The hemangioma measures at least 4.6 cm. The liver, gallbladder, and portal vein are otherwise normal. Pancreas: There is a 6 mm cyst in the tail the pancreas on coronal image 87. The pancreas is otherwise normal. Spleen: Normal in size without focal abnormality. Adrenals/Urinary Tract: The adrenal glands are normal. Probable tiny cyst in the anterior right kidney on axial image 25. Large cyst  in the left kidney measuring 7 cm. Kidneys are otherwise normal. No ureterectasis or ureteral stones. The bladder is normal. Stomach/Bowel: The stomach is unremarkable. A loop of small bowel just deep to the anterior abdominal wall and inferior to a ventral hernia is mildly hyperemic with no pneumatosis. By report, an incarcerated hernia was manually reduced and I suspect cysts this loop of bowel was the incarcerated loop. Just proximal to this loop, the jejunum is mildly dilated and contains fecal material, likely recently obstructed small bowel. The colon is unremarkable. The appendix is normal. Vascular/Lymphatic: No significant vascular findings are present. No enlarged abdominal or pelvic lymph nodes. Reproductive: Status post hysterectomy. No adnexal masses. Other: There is a fat containing umbilical hernia. More superiorly, there is a ventral hernia  containing omentum. Musculoskeletal: No acute or significant osseous findings. IMPRESSION: 1. There is a mildly hyperemic segment of small bowel just deep to the anterior abdominal wall and inferior to a ventral hernia. By report, an incarcerated hernia was manually reduced recently and this hyperemic loop of bowel likely represents the previously entrapped loop. There is no pneumatosis or evidence of perforation. Just proximal to this loop of bowel are dilated loops of jejunum containing fecal material consistent with recent obstruction. 2. Right hepatic lobe mass, consistent with a hemangioma. 3. There is a 6 mm cyst in the pancreatic tail. 4. 7 cm left renal cyst. 5. Fat containing umbilical hernia. Superior to the umbilicus, there is a ventral hernia containing omentum. Electronically Signed   By: Gerome Sam III M.D   On: 06/08/2018 15:48   A/P: Kara Braun is an 58 y.o. female with hx of HTN here today with what is most likely a recently reduced small bowel containing supraumbilical hernia  -Will plan to admit for observation -NPO tonight; likely clears in AM and advanced as tolerated -I had a long discussion with the patient and her family about all of the above. We discussed that given timing of her symptoms and quick presentation with subsequent reduction and reassurring CT the probability of ischemic small bowel is low; we discussed rational for her coming into the hospital for monitoring; we also discussed possibility of worsening of her condition and subsequently needing surgery -All of their questions answered to their satisfaction, they expressed understanding and agreement with plan of care  Stephanie Coup. Cliffton Asters, M.D. [facility]

## 2018-06-08 NOTE — ED Provider Notes (Signed)
[facility] EMERGENCY DEPARTMENT Provider Note   CSN: [id number] Arrival date & time: 06/08/18  1221     History   Chief Complaint Chief Complaint  Patient presents with  . Abdominal Pain    HPI Kara Braun is a 58 y.o. female.  The history is provided by the patient, the spouse and medical records.  Abdominal Pain  Pain location:  Periumbilical Pain quality: aching, sharp and stabbing   Pain radiates to:  Does not radiate Pain severity:  Severe Onset quality:  Sudden Timing:  Constant Progression:  Worsening Chronicity:  New Relieved by:  Nothing Worsened by:  Nothing Ineffective treatments:  None tried Associated symptoms: belching and cough   Associated symptoms: no chills, no constipation, no diarrhea, no dysuria, no fatigue, no fever, no flatus, no nausea, no shortness of breath and no vomiting     Past Medical History:  Diagnosis Date  . Hypertension     Patient Active Problem List   Diagnosis Date Noted  . Varicose veins 03/11/2012  . Swelling of limb 03/11/2012    Past Surgical History:  Procedure Laterality Date  . ABDOMINAL HYSTERECTOMY    . BUNIONECTOMY    . TUBAL LIGATION       OB History   No obstetric history on file.      Home Medications    Prior to Admission medications   Medication Sig Start Date End Date Taking? Authorizing Provider  ALPRAZolam Prudy Feeler) 0.25 MG tablet  03/04/12   [provider]  atenolol (TENORMIN) 100 MG tablet  01/11/12   [provider]  azelastine (ASTELIN) 137 MCG/SPRAY nasal spray Place 1 spray into the nose 2 (two) times daily. Use in each nostril as directed    [provider]  cetirizine (ZYRTEC) 10 MG tablet Take 10 mg by mouth daily.    [provider]  furosemide (LASIX) 20 MG tablet Take 20 mg by mouth 2 (two) times daily.    [provider]  losartan (COZAAR) 50 MG tablet  03/08/12   [provider]  traMADol (ULTRAM) 50 MG  tablet Take 50 mg by mouth every 6 (six) hours as needed.    [provider]    Family History Family History  Problem Relation Age of Onset  . Hypertension Mother   . Hypertension Father   . Heart disease Father   . Hypertension Sister   . Hypertension Brother     Social History Social History   Tobacco Use  . Smoking status: Former Smoker    Last attempt to quit: 05/12/1995    Years since quitting: 23.0  . Smokeless tobacco: Never Used  Substance Use Topics  . Alcohol use: Yes    Alcohol/week: 3.0 standard drinks    Types: 3 Glasses of wine per week  . Drug use: No     Allergies   Penicillins   Review of Systems Review of Systems  Constitutional: Negative for chills, diaphoresis, fatigue and fever.  HENT: Positive for congestion and rhinorrhea.   Respiratory: Positive for cough. Negative for chest tightness, shortness of breath and wheezing.   Gastrointestinal: Positive for abdominal distention and abdominal pain. Negative for blood in stool, constipation, diarrhea, flatus, nausea and vomiting.  Genitourinary: Negative for dysuria and flank pain.  Musculoskeletal: Negative for back pain.  Skin: Negative for rash and wound.  Neurological: Negative for light-headedness and headaches.  Psychiatric/Behavioral: Negative for agitation.  All other systems reviewed and are negative.  Physical Exam Updated Vital Signs BP (!) 159/87   Pulse (!) 56   Temp 97.9 F (36.6 C) (Oral)   Resp 18   Wt 75.3 kg   SpO2 98%   BMI 26.79 kg/m   Physical Exam Vitals signs and nursing note reviewed.  Constitutional:      General: She is not in acute distress.    Appearance: She is well-developed. She is not ill-appearing, toxic-appearing or diaphoretic.  HENT:     Head: Normocephalic and atraumatic.     Mouth/Throat:     Pharynx: No pharyngeal swelling or oropharyngeal exudate.  Eyes:     General: No scleral icterus.    Extraocular Movements: Extraocular  movements intact.     Conjunctiva/sclera: Conjunctivae normal.     Pupils: Pupils are equal, round, and reactive to light.  Neck:     Musculoskeletal: Neck supple.  Cardiovascular:     Rate and Rhythm: Normal rate and regular rhythm.     Heart sounds: No murmur.  Pulmonary:     Effort: Pulmonary effort is normal. No respiratory distress.     Breath sounds: Normal breath sounds.  Abdominal:     General: Abdomen is flat. Bowel sounds are normal.     Palpations: Abdomen is soft. There is mass (hernia).     Tenderness: There is abdominal tenderness in the periumbilical area. There is no right CVA tenderness, left CVA tenderness, guarding or rebound.     Hernia: A hernia is present. Hernia is present in the umbilical area.    Skin:    General: Skin is warm and dry.     Capillary Refill: Capillary refill takes less than 2 seconds.     Findings: No erythema.  Neurological:     General: No focal deficit present.     Mental Status: She is alert.  Psychiatric:        Mood and Affect: Mood normal.      ED Treatments / Results  Labs (all labs ordered are listed, but only abnormal results are displayed) Labs Reviewed  COMPREHENSIVE METABOLIC PANEL - Abnormal; Notable for the following components:      Result Value   Glucose, Bld 115 (*)    BUN <5 (*)    All other components within normal limits  URINALYSIS, ROUTINE W REFLEX MICROSCOPIC - Abnormal; Notable for the following components:   Color, Urine STRAW (*)    APPearance HAZY (*)    Specific Gravity, Urine 1.003 (*)    All other components within normal limits  URINE CULTURE  CBC WITH DIFFERENTIAL/PLATELET  LIPASE, BLOOD  LACTIC ACID, PLASMA    EKG None  Radiology Ct Abdomen Pelvis W Contrast  Result Date: 06/08/2018 CLINICAL DATA:  Severe abdominal pain. Recent reduction of incarcerated hernia. EXAM: CT ABDOMEN AND PELVIS WITH CONTRAST TECHNIQUE: Multidetector CT imaging of the abdomen and pelvis was performed using the  standard protocol following bolus administration of intravenous contrast. CONTRAST:  OMNIPAQUE IOHEXOL 300 MG/ML  SOLN COMPARISON:  None. FINDINGS: Lower chest: No acute abnormality. Hepatobiliary: There is a mass in the right hepatic lobe with peripheral discontinuous nodular enhancement, consistent with a hemangioma. This does not completely fill in on delayed images. The hemangioma measures at least 4.6 cm. The liver, gallbladder, and portal vein are otherwise normal. Pancreas: There is a 6 mm cyst in the tail the pancreas on coronal image 87. The pancreas is otherwise normal. Spleen: Normal in size without focal abnormality. Adrenals/Urinary Tract: The adrenal glands  are normal. Probable tiny cyst in the anterior right kidney on axial image 25. Large cyst in the left kidney measuring 7 cm. Kidneys are otherwise normal. No ureterectasis or ureteral stones. The bladder is normal. Stomach/Bowel: The stomach is unremarkable. A loop of small bowel just deep to the anterior abdominal wall and inferior to a ventral hernia is mildly hyperemic with no pneumatosis. By report, an incarcerated hernia was manually reduced and I suspect cysts this loop of bowel was the incarcerated loop. Just proximal to this loop, the jejunum is mildly dilated and contains fecal material, likely recently obstructed small bowel. The colon is unremarkable. The appendix is normal. Vascular/Lymphatic: No significant vascular findings are present. No enlarged abdominal or pelvic lymph nodes. Reproductive: Status post hysterectomy. No adnexal masses. Other: There is a fat containing umbilical hernia. More superiorly, there is a ventral hernia containing omentum. Musculoskeletal: No acute or significant osseous findings. IMPRESSION: 1. There is a mildly hyperemic segment of small bowel just deep to the anterior abdominal wall and inferior to a ventral hernia. By report, an incarcerated hernia was manually reduced recently and this hyperemic  loop of bowel likely represents the previously entrapped loop. There is no pneumatosis or evidence of perforation. Just proximal to this loop of bowel are dilated loops of jejunum containing fecal material consistent with recent obstruction. 2. Right hepatic lobe mass, consistent with a hemangioma. 3. There is a 6 mm cyst in the pancreatic tail. 4. 7 cm left renal cyst. 5. Fat containing umbilical hernia. Superior to the umbilicus, there is a ventral hernia containing omentum. Electronically Signed   By: Gerome Sam III M.D   On: 06/08/2018 15:48    Procedures Hernia reduction Date/Time: 06/08/2018 4:49 PM Performed by: Heide Scales, MD Authorized by: Heide Scales, MD  Consent: Verbal consent obtained. Risks and benefits: risks, benefits and alternatives were discussed Consent given by: patient Patient understanding: patient states understanding of the procedure being performed Patient identity confirmed: verbally with patient Time out: Immediately prior to procedure a "time out" was called to verify the correct patient, procedure, equipment, support staff and site/side marked as required. Local anesthesia used: no  Anesthesia: Local anesthesia used: no  Sedation: Patient sedated: no  Patient tolerance: Patient tolerated the procedure well with no immediate complications    (including critical care time)  Medications Ordered in ED Medications  benzonatate (TESSALON) capsule 100 mg (has no administration in time range)  HYDROmorphone (DILAUDID) injection 1 mg (1 mg Intravenous Given 06/08/18 1311)  iohexol (OMNIPAQUE) 300 MG/ML solution 100 mL (100 mLs Intravenous Contrast Given 06/08/18 1453)     Initial Impression / Assessment and Plan / ED Course  I have reviewed the triage vital signs and the nursing notes.  Pertinent labs & imaging results that were available during my care of the patient were reviewed by me and considered in my medical decision making  (see chart for details).     Kataleya Watchman is a 58 y.o. female with a past medical history significant for hypertension and prior hysterectomy who presents with concern for abdominal hernia.  Patient reports after last of that she has had cough and congestion and went to urgent care today and while having a coughing fit had onset of severe abdominal pain.  She is never had this pain before.  At the urgent care they told her she likely has an abdominal hernia and needs to follow-up with general surgery.  They were unable to reduce the hernia  and she was in severe pain so she came to the emergency department evaluation.  She reports nausea but no vomiting.  She reports he has had no flatus or bowel movements today since the onset of the pain.  She reports the pain is up to 10 out of 10 in severity.  No back or flank pain.  No chest pain or shortness of breath at this time.  She reports that her cough is been the same for the last several days.  No acute exacerbation of that.  No other complaints on arrival.  No leg pain, leg swelling, urinary symptoms, or pelvic symptoms.  On exam, patient has what feels to be an periumbilical hernia.  Lungs were clear and chest was nontender.  Bowel sounds were appreciated on the side of the hernia.  It was not pulsatile.  Normal pulses and strength in legs.  Normal sensation in legs.  No edema in legs.  Bedside reduction was attempted of the what was felt to be an abdominal hernia.  Ice was applied and pain medication was provided.  Patient was placed into a headdown position and gentle pressure was applied.  Hernia felt like it shifted and partially reduced.  She was still having severe abdominal pain.  CT scan will be ordered to rule out bowel injury or perforation.  CT scan shows reduction of the hernia and some hyperemic bowel.  General surgery was called to come assess the patient to see if she will likely need observation or admission.  Anticipate following up  on general surgery recommendations.       Surgery will admit for further monitoring of abdominal pain.    Final Clinical Impressions(s) / ED Diagnoses   Final diagnoses:  Periumbilical abdominal pain    ED Discharge Orders    None     Clinical Impression: 1. Periumbilical abdominal pain     Disposition: Admit  This note was prepared with assistance of Dragon voice recognition software. Occasional wrong-word or sound-a-like substitutions may have occurred due to the inherent limitations of voice recognition software.     Calib Wadhwa, Canary Brimhristopher J, MD 06/08/18 2043

## 2018-06-09 LAB — CBC WITH DIFFERENTIAL/PLATELET
Abs Immature Granulocytes: 0 10*3/uL (ref 0.00–0.07)
Basophils Absolute: 0 10*3/uL (ref 0.0–0.1)
Basophils Relative: 1 %
Eosinophils Absolute: 0.1 10*3/uL (ref 0.0–0.5)
Eosinophils Relative: 3 %
HCT: 37 % (ref 36.0–46.0)
Hemoglobin: 12 g/dL (ref 12.0–15.0)
Immature Granulocytes: 0 %
Lymphocytes Relative: 50 %
Lymphs Abs: 1.8 10*3/uL (ref 0.7–4.0)
MCH: 29 pg (ref 26.0–34.0)
MCHC: 32.4 g/dL (ref 30.0–36.0)
MCV: 89.4 fL (ref 80.0–100.0)
Monocytes Absolute: 0.5 10*3/uL (ref 0.1–1.0)
Monocytes Relative: 14 %
Neutro Abs: 1.1 10*3/uL — ABNORMAL LOW (ref 1.7–7.7)
Neutrophils Relative %: 32 %
Platelets: 311 10*3/uL (ref 150–400)
RBC: 4.14 MIL/uL (ref 3.87–5.11)
RDW: 13.8 % (ref 11.5–15.5)
WBC: 3.5 10*3/uL — ABNORMAL LOW (ref 4.0–10.5)
nRBC: 0 % (ref 0.0–0.2)

## 2018-06-09 LAB — URINE CULTURE: Culture: NO GROWTH

## 2018-06-09 LAB — BASIC METABOLIC PANEL
Anion gap: 9 (ref 5–15)
BUN: 5 mg/dL — ABNORMAL LOW (ref 6–20)
CO2: 23 mmol/L (ref 22–32)
Calcium: 8.8 mg/dL — ABNORMAL LOW (ref 8.9–10.3)
Chloride: 105 mmol/L (ref 98–111)
Creatinine, Ser: 0.69 mg/dL (ref 0.44–1.00)
GFR calc Af Amer: >60 mL/min (ref >60)
GFR calc non Af Amer: >60 mL/min (ref >60)
Glucose, Bld: 82 mg/dL (ref 70–99)
Potassium: 3.5 mmol/L (ref 3.5–5.1)
Sodium: 137 mmol/L (ref 135–145)

## 2018-06-09 LAB — HIV ANTIBODY (ROUTINE TESTING W REFLEX): HIV Screen 4th Generation wRfx: NONREACTIVE

## 2018-06-09 NOTE — Discharge Summary (Signed)
Patient ID: Kara Braun [id number] [birth year] 58 y.o.  Admit date: 06/08/2018 Discharge date: 06/09/2018  Admitting Diagnosis: Ventral hernia  Discharge Diagnosis Patient Active Problem List   Diagnosis Date Noted  . Ventral hernia 06/08/2018  . Varicose veins 03/11/2012  . Swelling of limb 03/11/2012    Consultants none  Reason for Admission: Kara Braun is an 58 y.o. female with hx of HTN whom presented to the ED today with acute onset on abdominal pain - supraumbilical. Starting 2/13 she developed a "cold" with coughing. Late morning after church she noted acute onset of sharp supraumbilical pain. Did not radiate. Persistent pain and nothing made it better or worse. She had some nausea but denies emesis. She went to an urgent care and was told she may have a hernia. She subsequently was told to go to the ER and she came here. In the ED, by report she had a supraumbilical hernia that was gently massaged and subsequently felt a pop and it appeared to reduce. She reported dramatic improvement in her symptoms following. She underwent CT which demonstrated a now fat containing supraumbilical hernia with a knuckle of bowel nearby that is mildly hyperemic; no pneumatosis or signs of perforation.  Procedures none  Hospital Course:  The patient was admitted and observed on clear liquids.  By HD 1, her pain had resolved and she had no nausea and was tolerating her diet.  She was able to tolerate solid food prior to discharge.  She will follow up as an outpatient to discuss elective repair.    Physical Exam: Heart: regular Lungs: CTAB Abd: soft, NT, NS, +BS, hernia is soft  Allergies as of 06/09/2018      Reactions   Ace Inhibitors Cough   Penicillins Swelling   Did it involve swelling of the face/tongue/throat, SOB, or low BP? Swelling- site not recalled Did it involve sudden or severe rash/hives, skin peeling, or any reaction on the inside of your mouth or nose? Unk Did you  need to seek medical attention at a hospital or doctor's office? Unk When did it last happen? Childhood If all above answers are "NO", may proceed with cephalosporin use.      Medication List    TAKE these medications   acetaminophen 500 MG tablet Commonly known as:  TYLENOL Take 500-1,000 mg by mouth every 8 (eight) hours as needed (for headaches).   ALPRAZolam 0.25 MG tablet Commonly known as:  XANAX Take 0.25 mg by mouth daily as needed for anxiety.   amitriptyline 10 MG tablet Commonly known as:  ELAVIL Take 10 mg by mouth at bedtime as needed for sleep.   atenolol 100 MG tablet Commonly known as:  TENORMIN Take 100 mg by mouth daily.   betamethasone dipropionate 0.05 % cream Commonly known as:  DIPROLENE Apply 1 application topically See admin instructions. Apply as directed to skin and hands 2 times a day for 14 days   CALCIUM 600+D3 PO Take 1 tablet by mouth daily.   FISH OIL PO Take 1 capsule by mouth daily.   furosemide 20 MG tablet Commonly known as:  LASIX Take 20 mg by mouth 2 (two) times daily as needed for fluid.   ibuprofen 200 MG tablet Commonly known as:  ADVIL,MOTRIN Take 800 mg by mouth every 6 (six) hours as needed (for pain or inflammation).   ipratropium 0.06 % nasal spray Commonly known as:  ATROVENT Place 1-2 sprays into both nostrils daily as needed for rhinitis.  levocetirizine 5 MG tablet Commonly known as:  XYZAL Take 5 mg by mouth every evening.   losartan 50 MG tablet Commonly known as:  COZAAR Take 50 mg by mouth at bedtime.   LOTEMAX 0.5 % Gel Generic drug:  Loteprednol Etabonate Place 1 drop into both eyes 4 (four) times daily as needed (for inflammation).   meloxicam 15 MG tablet Commonly known as:  MOBIC Take 15 mg by mouth daily as needed for pain.   mometasone-formoterol 100-5 MCG/ACT Aero Commonly known as:  DULERA Inhale 2 puffs into the lungs 2 (two) times daily as needed for wheezing (or coughing).         Follow-up Information    Axel Filler, MD. Schedule an appointment as soon as possible for a visit in 2 week(s).   Specialty:  General Surgery Why:  Make an appointment for your hernia to discuss repair. Contact information: [address] [place] [address] [phone]           Signed: Barnetta Chapel, [facility] Surgery 06/09/2018, 10:36 AM Pager: [phone]

## 2018-06-09 NOTE — Plan of Care (Signed)
  Problem: Pain Managment: Goal: General experience of comfort will improve Outcome: Progressing   Problem: Safety: Goal: Ability to remain free from injury will improve Outcome: Progressing   Problem: Skin Integrity: Goal: Risk for impaired skin integrity will decrease Outcome: Progressing   

## 2018-06-09 NOTE — Progress Notes (Signed)
Pt discharge education and instructions completed with pt at bedside; pt denies any questions. IV removed and pt discharge home with family to transport her home. Pt transported off unit via wheelchair with belongings to the side. Dionne Bucy RN

## 2018-06-09 NOTE — Discharge Instructions (Signed)
Hernia, Adult ° °  ° °A hernia happens when tissue inside your body pushes out through a weak spot in your belly muscles (abdominal wall). This makes a round lump (bulge). The lump may be: °· In a scar from surgery that was done in your belly (incisional hernia). °· Near your belly button (umbilical hernia). °· In your groin (inguinal hernia). Your groin is the area where your leg meets your lower belly (abdomen). This kind of hernia could also be: °? In your scrotum, if you are female. °? In folds of skin around your vagina, if you are female. °· In your upper thigh (femoral hernia). °· Inside your belly (hiatal hernia). This happens when your stomach slides above the muscle between your belly and your chest (diaphragm). °If your hernia is small and it does not cause pain, you may not need treatment. If your hernia is large or it causes pain, you may need surgery. °Follow these instructions at home: °Activity °· Avoid stretching or overusing (straining) the muscles near your hernia. Straining can happen when you: °? Lift something heavy. °? Poop (have a bowel movement). °· Do not lift anything that is heavier than 10 lb (4.5 kg), or the limit that you are told, until your doctor says that it is safe. °· Use the strength of your legs when you lift something heavy. Do not use only your back muscles to lift. °General instructions °· Do these things if told by your doctor so you do not have trouble pooping (constipation): °? Drink enough fluid to keep your pee (urine) pale yellow. °? Eat foods that are high in fiber. These include fresh fruits and vegetables, whole grains, and beans. °? Limit foods that are high in fat and processed sugars. These include foods that are fried or sweet. °? Take medicine for trouble pooping. °· When you cough, try to cough gently. °· You may try to push your hernia in by very gently pressing on it when you are lying down. Do not try to force the bulge back in if it will not push in  easily. °· If you are overweight, work with your doctor to lose weight safely. °· Do not use any products that have nicotine or tobacco in them. These include cigarettes and e-cigarettes. If you need help quitting, ask your doctor. °· If you will be having surgery (hernia repair), watch your hernia for changes in shape, size, or color. Tell your doctor if you see any changes. °· Take over-the-counter and prescription medicines only as told by your doctor. °· Keep all follow-up visits as told by your doctor. °Contact a doctor if: °· You get new pain, swelling, or redness near your hernia. °· You poop fewer times in a week than normal. °· You have trouble pooping. °· You have poop (stool) that is more dry than normal. °· You have poop that is harder or larger than normal. °Get help right away if: °· You have a fever. °· You have belly pain that gets worse. °· You feel sick to your stomach (nauseous). °· You throw up (vomit). °· Your hernia cannot be pushed in by very gently pressing on it when you are lying down. Do not try to force the bulge back in if it will not push in easily. °· Your hernia: °? Changes in shape or size. °? Changes color. °? Feels hard or it hurts when you touch it. °These symptoms may represent a serious problem that is an emergency. Do not   wait to see if the symptoms will go away. Get medical help right away. Call your local emergency services (911 in the [place] °Summary °· A hernia happens when tissue inside your body pushes out through a weak spot in the belly muscles. This creates a bulge. °· If your hernia is small and it does not hurt, you may not need treatment. If your hernia is large or it hurts, you may need surgery. °· If you will be having surgery, watch your hernia for changes in shape, size, or color. Tell your doctor about any changes. °This information is not intended to replace advice given to you by your health care provider. Make sure you discuss any questions you have with  your health care provider. °Document Released: 09/27/2009 Document Revised: 01/09/2017 Document Reviewed: 01/09/2017 °Elsevier Interactive Patient Education © 2019 [employer] ° °

## 2018-06-09 NOTE — Progress Notes (Signed)
Pt tolerated regular meal with no problem. Pt denies any pain or discomfort; PA Kelly notified and OK to discharge pt. Dionne Bucy RN

## 2018-06-12 ENCOUNTER — Other Ambulatory Visit: Payer: Self-pay | Admitting: Family Medicine

## 2018-06-12 DIAGNOSIS — K439 Ventral hernia without obstruction or gangrene: Secondary | ICD-10-CM

## 2019-03-25 ENCOUNTER — Other Ambulatory Visit: Payer: Self-pay | Admitting: Cardiology

## 2019-03-25 DIAGNOSIS — Z20822 Contact with and (suspected) exposure to covid-19: Secondary | ICD-10-CM

## 2019-03-28 ENCOUNTER — Telehealth: Payer: Self-pay

## 2019-03-28 NOTE — Telephone Encounter (Signed)
Pt called for covid results. Advised pt that results are not back and to keep an look out for her MyChart notifications.

## 2019-03-29 ENCOUNTER — Telehealth: Payer: Self-pay | Admitting: Critical Care Medicine

## 2019-03-29 LAB — NOVEL CORONAVIRUS, NAA: SARS-CoV-2, NAA: DETECTED — AB

## 2019-03-29 NOTE — Telephone Encounter (Signed)
I connected with this patient who is Covid positive from December 2.  The patient is minimally symptomatic at this time and is doing well at this time  Unfortunately her husband is being admitted with Covid infection to the [facility] hospital  The patient knows her isolation time and knows the health department may be in touch  She is not a candidate for the monoclonal antibody

## 2019-04-02 IMAGING — CT CT ABD-PELV W/ CM
2 of 5 series · 15 of 46 positions shown, 17 images · IV contrast (Omni 300)
Comparison: None.

CLINICAL DATA: Severe abdominal pain. Recent reduction of
incarcerated hernia.

EXAM:
CT ABDOMEN AND PELVIS WITH CONTRAST
TECHNIQUE: Multidetector CT imaging of the abdomen and pelvis was performed
using the standard protocol following bolus administration of
intravenous contrast.
CONTRAST:  100mL OMNIPAQUE IOHEXOL 300 MG/ML  SOLN

[Series 3: a/p w/ 5mm · axial · 0.65mm/px · z∈[+956,+1366]mm · 12 of 92 slices shown, 14 images]
[im 5/92  soft-tissue]
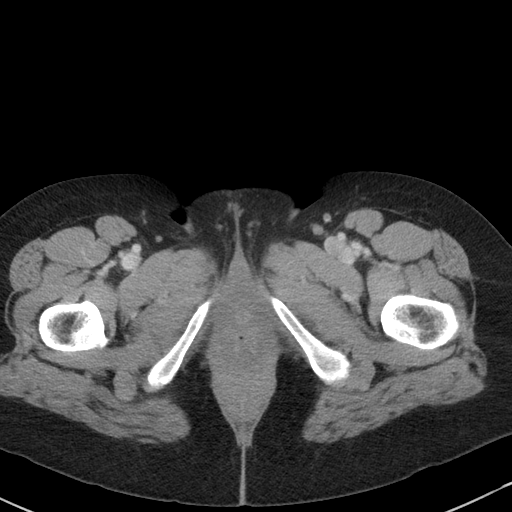
[im 5/92  bone]
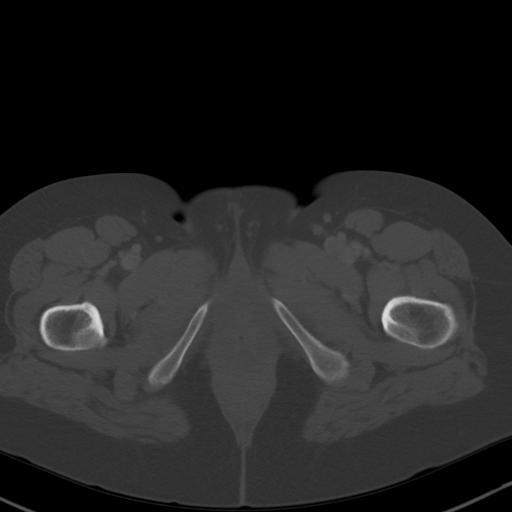
[im 14/92  soft-tissue]
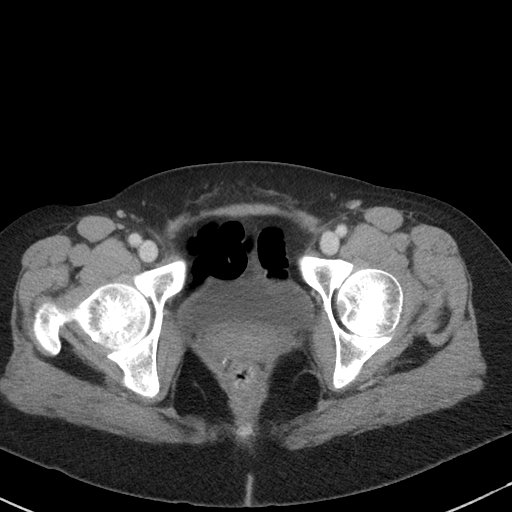
[im 22/92  soft-tissue]
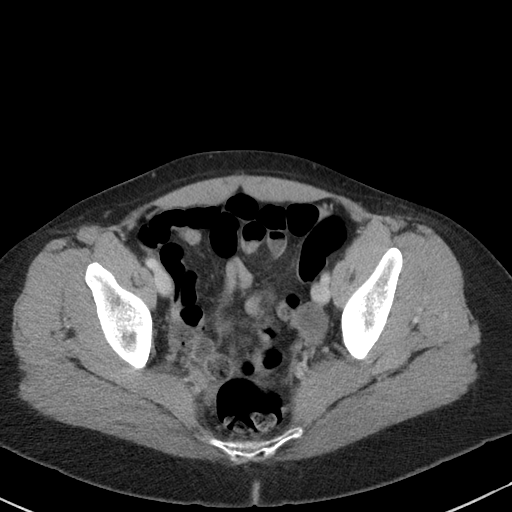
[im 27/92  soft-tissue]
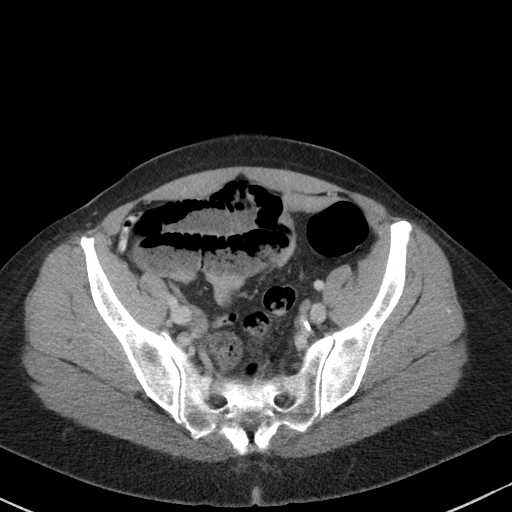
[im 35/92  soft-tissue]
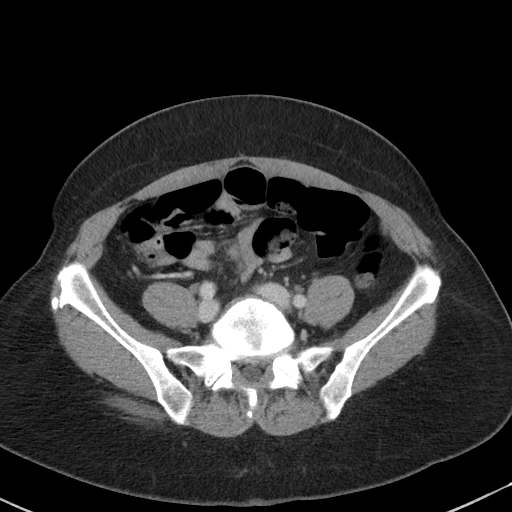
[im 44/92  soft-tissue]
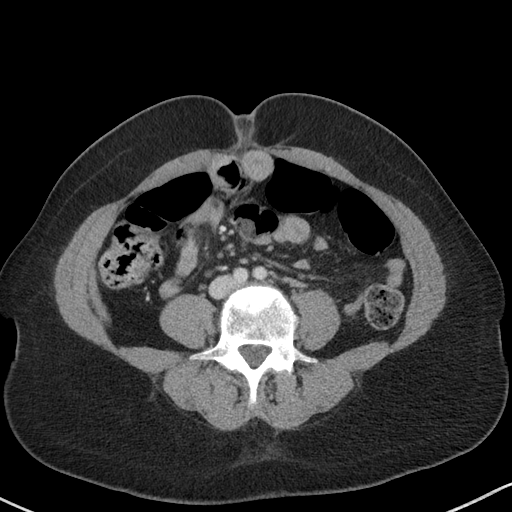
[im 48/92  soft-tissue]
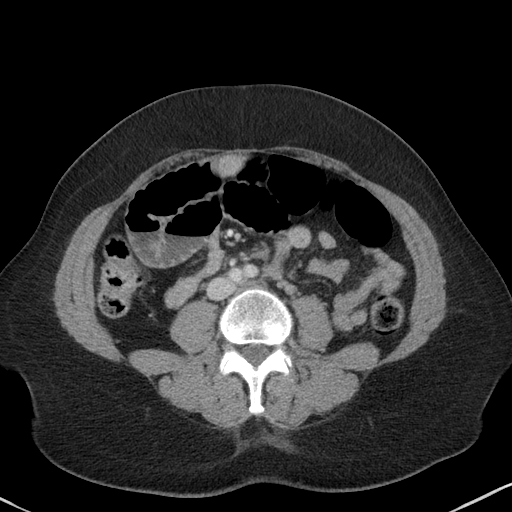
[im 57/92  soft-tissue]
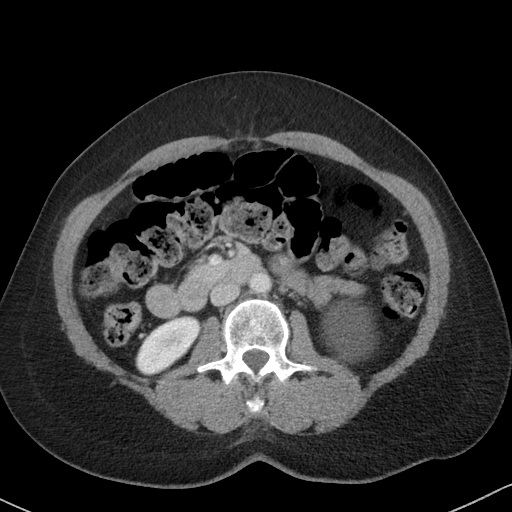
[im 66/92  soft-tissue]
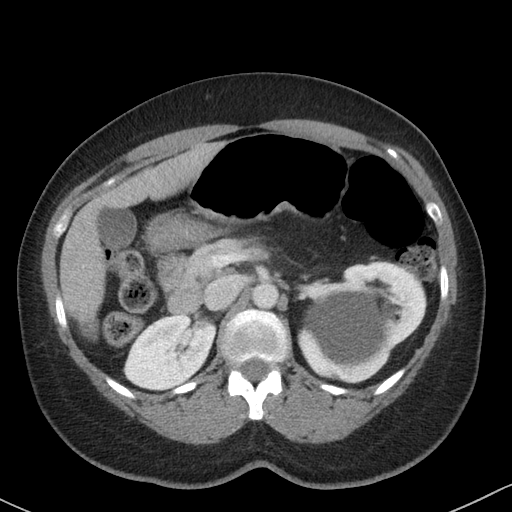
[im 66/92  bone]
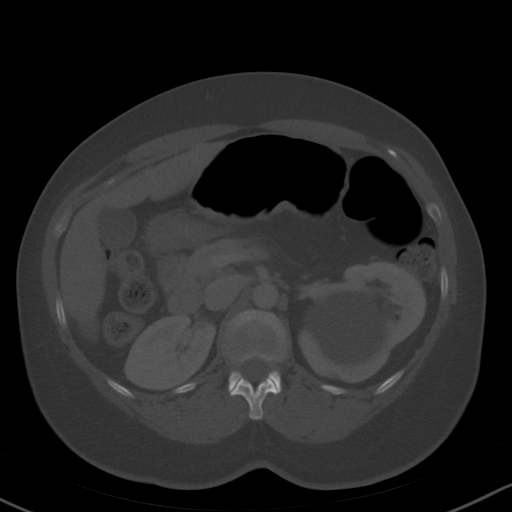
[im 70/92  soft-tissue]
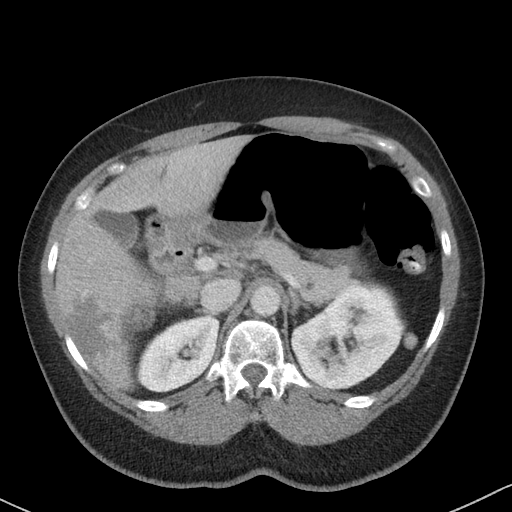
[im 79/92  soft-tissue]
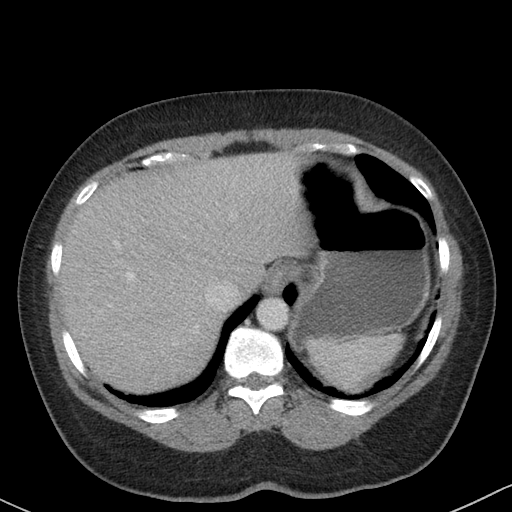
[im 87/92  soft-tissue]
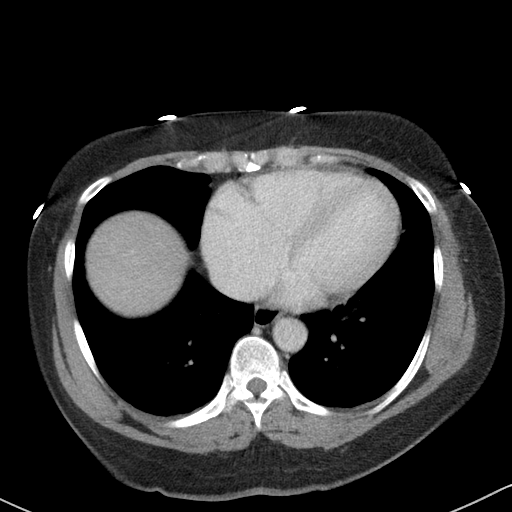

[Series 6: a/p w/ cor · coronal · 0.62mm/px · 3 of 151 slices shown]
[im 51/151  soft-tissue]
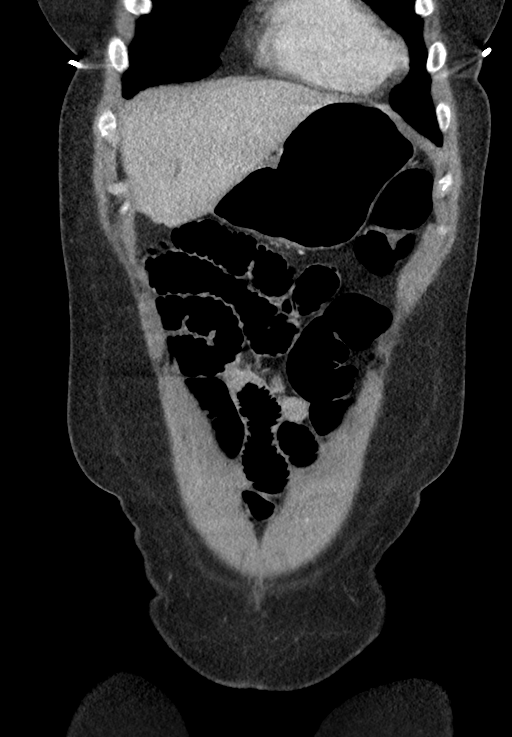
[im 67/151  soft-tissue]
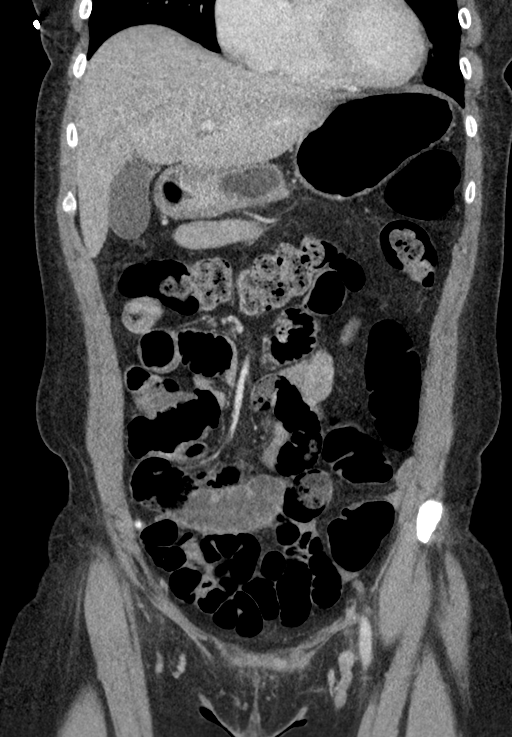
[im 84/151  soft-tissue]
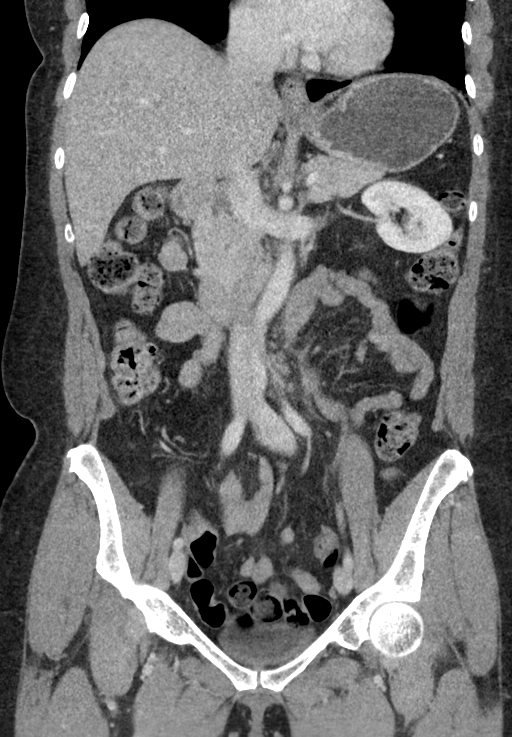

[15 of 46 positions shown; findings below may reference images not displayed]

FINDINGS: Lower chest: No acute abnormality.

Hepatobiliary: There is a mass in the right hepatic lobe with
peripheral discontinuous nodular enhancement, consistent with a
hemangioma. This does not completely fill in on delayed images. The
hemangioma measures at least 4.6 cm. The liver, gallbladder, and
portal vein are otherwise normal.

Pancreas: There is a 6 mm cyst in the tail the pancreas on coronal
image 87. The pancreas is otherwise normal.

Spleen: Normal in size without focal abnormality.

Adrenals/Urinary Tract: The adrenal glands are normal. Probable tiny
cyst in the anterior right kidney on axial image 25. Large cyst in
the left kidney measuring 7 cm. Kidneys are otherwise normal. No
ureterectasis or ureteral stones. The bladder is normal.

Stomach/Bowel: The stomach is unremarkable. A loop of small bowel
just deep to the anterior abdominal wall and inferior to a ventral
hernia is mildly hyperemic with no pneumatosis. By report, an
incarcerated hernia was manually reduced and I suspect cysts this
loop of bowel was the incarcerated loop. Just proximal to this loop,
the jejunum is mildly dilated and contains fecal material, likely
recently obstructed small bowel. The colon is unremarkable. The
appendix is normal.

Vascular/Lymphatic: No significant vascular findings are present. No
enlarged abdominal or pelvic lymph nodes.

Reproductive: Status post hysterectomy. No adnexal masses.

Other: There is a fat containing umbilical hernia. More superiorly,
there is a ventral hernia containing omentum.

Musculoskeletal: No acute or significant osseous findings.
IMPRESSION: 1. There is a mildly hyperemic segment of small bowel just deep to
the anterior abdominal wall and inferior to a ventral hernia. By
report, an incarcerated hernia was manually reduced recently and
this hyperemic loop of bowel likely represents the previously
entrapped loop. There is no pneumatosis or evidence of perforation.
Just proximal to this loop of bowel are dilated loops of jejunum
containing fecal material consistent with recent obstruction.
2. Right hepatic lobe mass, consistent with a hemangioma.
3. There is a 6 mm cyst in the pancreatic tail.
4. 7 cm left renal cyst.
5. Fat containing umbilical hernia. Superior to the umbilicus, there
is a ventral hernia containing omentum.

## 2020-06-16 ENCOUNTER — Other Ambulatory Visit: Payer: Self-pay

## 2020-06-16 ENCOUNTER — Ambulatory Visit: Payer: PRIVATE HEALTH INSURANCE | Admitting: Sports Medicine

## 2020-06-16 ENCOUNTER — Encounter: Payer: Self-pay | Admitting: Sports Medicine

## 2020-06-16 DIAGNOSIS — L608 Other nail disorders: Secondary | ICD-10-CM

## 2020-06-16 DIAGNOSIS — M79674 Pain in right toe(s): Secondary | ICD-10-CM

## 2020-06-16 DIAGNOSIS — M79675 Pain in left toe(s): Secondary | ICD-10-CM | POA: Diagnosis not present

## 2020-06-16 DIAGNOSIS — B351 Tinea unguium: Secondary | ICD-10-CM | POA: Diagnosis not present

## 2020-06-16 NOTE — Progress Notes (Signed)
Subjective: Kara Braun is a 60 y.o. female patient seen today in office with complaint of mildly painful thickened and discolored nails at both fifth toes. Patient is desiring treatment for nail changes; has tried OTC topicals/Medication in the past with no improvement. Reports that nails are becoming difficult to manage because of the thickness.  Patient also reports that on her right great toe there is some incurvation of her nail goes repeatedly for pedicures and may trim but it tends to grow out and begin to the skin denies any significant redness warmth swelling or drainage.  Patient has no other pedal complaints at this time.   Review of system noncontributory.  Patient Active Problem List   Diagnosis Date Noted  . Ventral hernia 06/08/2018  . Varicose veins 03/11/2012  . Swelling of limb 03/11/2012    Current Outpatient Medications on File Prior to Visit  Medication Sig Dispense Refill  . augmented betamethasone dipropionate (DIPROLENE-AF) 0.05 % ointment betamethasone, augmented 0.05 % topical ointment    . azithromycin (ZITHROMAX) 250 MG tablet azithromycin 250 mg tablet  TAKE 2 TABLETS BY MOUTH ON DAY 1, THEN TAKE 1 TABLET DAILY ON DAYS 2-5    . hydrOXYzine (ATARAX/VISTARIL) 25 MG tablet hydroxyzine HCl 25 mg tablet    . ketoconazole (NIZORAL) 2 % shampoo ketoconazole 2 % shampoo  Lather and let sit for 5 minutes and rinse off.    . predniSONE (DELTASONE) 50 MG tablet 1 tablet    . promethazine-codeine (PHENERGAN WITH CODEINE) 6.25-10 MG/5ML syrup 5 ml every 6 hrs as needed Orally 5 days    . silver sulfADIAZINE (SILVADENE) 1 % cream silver sulfadiazine 1 % topical cream    . acetaminophen (TYLENOL) 500 MG tablet Take 500-1,000 mg by mouth every 8 (eight) hours as needed (for headaches).    . ALPRAZolam (XANAX) 0.25 MG tablet Take 0.25 mg by mouth daily as needed for anxiety.     Marland Kitchen amitriptyline (ELAVIL) 10 MG tablet Take 10 mg by mouth at bedtime as needed for sleep.    Marland Kitchen  atenolol (TENORMIN) 100 MG tablet Take 100 mg by mouth daily.     . betamethasone dipropionate (DIPROLENE) 0.05 % cream Apply 1 application topically See admin instructions. Apply as directed to skin and hands 2 times a day for 14 days    . Calcium Carb-Cholecalciferol (CALCIUM 600+D3 PO) Take 1 tablet by mouth daily.    . chlorpheniramine-HYDROcodone (TUSSIONEX) 10-8 MG/5ML SUER hydrocodone 10 mg-chlorpheniramine 8 mg/5 mL oral susp extend.rel 12hr  TAKE BY MOUTH TWICE A DAY AS NEEDED    . cholecalciferol (VITAMIN D) 25 MCG (1000 UNIT) tablet 1 tablet    . diazepam (VALIUM) 5 MG tablet diazepam 5 mg tablet    . fexofenadine (ALLEGRA ALLERGY) 180 MG tablet 1 tablet    . fluconazole (DIFLUCAN) 150 MG tablet fluconazole 150 mg tablet  TAKE 1 TABLET AS NEEDED BY ORAL ROUTE AS DIRECTED FOR 1 DAY.    . furosemide (LASIX) 20 MG tablet Take 20 mg by mouth 2 (two) times daily as needed for fluid.     Marland Kitchen gabapentin (NEURONTIN) 300 MG capsule Take by mouth.    . gabapentin (NEURONTIN) 300 MG capsule gabapentin 300 mg capsule  Take 2 capsules every day by oral route at bedtime.    . hydrOXYzine (ATARAX/VISTARIL) 25 MG tablet Take 25 mg by mouth daily.    Marland Kitchen ibuprofen (ADVIL,MOTRIN) 200 MG tablet Take 800 mg by mouth every 6 (six) hours as  needed (for pain or inflammation).    Marland Kitchen ipratropium (ATROVENT) 0.06 % nasal spray Place 1-2 sprays into both nostrils daily as needed for rhinitis.     Marland Kitchen ketoconazole (NIZORAL) 2 % shampoo Apply topically.    Marland Kitchen levocetirizine (XYZAL) 5 MG tablet Take 5 mg by mouth every evening.    Marland Kitchen losartan (COZAAR) 50 MG tablet Take 50 mg by mouth at bedtime.     . Loteprednol Etabonate (LOTEMAX) 0.5 % GEL Place 1 drop into both eyes 4 (four) times daily as needed (for inflammation).     . meloxicam (MOBIC) 15 MG tablet Take 15 mg by mouth daily as needed for pain.     . metroNIDAZOLE (FLAGYL) 500 MG tablet metronidazole 500 mg tablet  TAKE 1 TABLET BY MOUTH TWICE A DAY FOR 7 DAYS     . metroNIDAZOLE (METROGEL) 0.75 % vaginal gel SMARTSIG:1 Applicator Vaginal Daily    . metroNIDAZOLE (METROGEL) 0.75 % vaginal gel metronidazole 0.75 % vaginal gel  Insert 1 applicatorful every day by vaginal route for 5 days.    . mometasone-formoterol (DULERA) 100-5 MCG/ACT AERO Inhale 2 puffs into the lungs 2 (two) times daily as needed for wheezing (or coughing).    Marland Kitchen ofloxacin (OCUFLOX) 0.3 % ophthalmic solution ofloxacin 0.3 % eye drops  INSTILL 1 DROP BY OPHTHALMIC ROUTE 4 TIMES EVERY DAY INTO RIGHT EYE FOR 1 WEEK    . Omega-3 Fatty Acids (FISH OIL PO) Take 1 capsule by mouth daily.     . ondansetron (ZOFRAN-ODT) 4 MG disintegrating tablet ondansetron 4 mg disintegrating tablet    . PARoxetine (PAXIL-CR) 25 MG 24 hr tablet Take 25 mg by mouth daily.    Marland Kitchen PARoxetine (PAXIL-CR) 25 MG 24 hr tablet paroxetine ER 25 mg tablet,extended release 24 hr  Take 1 tablet every day by oral route for 30 days.    . predniSONE (DELTASONE) 10 MG tablet prednisone 10 mg tablet    . predniSONE (DELTASONE) 50 MG tablet Take 50 mg by mouth daily.    . traMADol (ULTRAM) 50 MG tablet tramadol 50 mg tablet    . Zinc 100 MG TABS 1 tablet     No current facility-administered medications on file prior to visit.    Allergies  Allergen Reactions  . Ace Inhibitors Cough and Hives    Other reaction(s): cough, Cough (ALLERGY/intolerance)  . Doxycycline Nausea And Vomiting    Other reaction(s): nausea  . Penicillins Swelling    Did it involve swelling of the face/tongue/throat, SOB, or low BP? Swelling- site not recalled Did it involve sudden or severe rash/hives, skin peeling, or any reaction on the inside of your mouth or nose? Unk Did you need to seek medical attention at a hospital or doctor's office? Unk When did it last happen? Childhood If all above answers are "NO", may proceed with cephalosporin use.     Objective: Physical Exam  General: Well developed, nourished, no acute distress,  awake, alert and oriented x 3  Vascular: Dorsalis pedis artery 2/4 bilateral, Posterior tibial artery 1/4 bilateral, skin temperature warm to warm proximal to distal bilateral lower extremities, no varicosities, pedal hair present bilateral.  Neurological: Gross sensation present via light touch bilateral.   Dermatological: Skin is warm, dry, and supple bilateral, bilateral fifth toenails are tender, short thick, and discolored with mild subungal debris, there is also pincer nail deformity noted most frequently at the right hallux toenail with no surrounding signs of infection, no webspace macerations present bilateral,  no open lesions present bilateral, no callus/corns/hyperkeratotic tissue present bilateral.  Musculoskeletal: No symptomatic boney deformities noted bilateral. Muscular strength within normal limits without painon range of motion. No pain with calf compression bilateral.  Assessment and Plan:  Problem List Items Addressed This Visit   None   Visit Diagnoses    Nail fungus    -  Primary   Relevant Medications   azithromycin (ZITHROMAX) 250 MG tablet   fluconazole (DIFLUCAN) 150 MG tablet   ketoconazole (NIZORAL) 2 % shampoo   ketoconazole (NIZORAL) 2 % shampoo   metroNIDAZOLE (FLAGYL) 500 MG tablet   Other Relevant Orders   Fungus culture w smear   Pincer nail deformity       Toe pain, right       Toe pain, left          -Examined patient -Discussed treatment options for painful dystrophic nails and pain at right hallux toenail pincer deformity -Advised patient to consider partial nail avulsion procedure with removal of offending borders on the right however at this time patient wants to consider having the done at a subsequent office visit -Fungal culture was obtained by removing a portion of the hard nail itself from each of the involved toenails using a sterile nail nipper and sent to [facility] lab. Patient tolerated the biopsy procedure well without discomfort or need  for anesthesia.  -Patient to return in 4 weeks for follow up evaluation and discussion of fungal culture results and for nail procedure on right or sooner if symptoms worsen.  Asencion Islam, DPM

## 2020-07-05 ENCOUNTER — Telehealth: Payer: Self-pay | Admitting: Sports Medicine

## 2020-07-05 NOTE — Telephone Encounter (Signed)
Patient had to reschedule her appointment to 5/5 due to work meeting. Wanted to discuss culture results prior to appointment and requested a "telemed visit." Please contact patient to discus results.

## 2020-07-05 NOTE — Telephone Encounter (Signed)
Please contact patient to set up a virtual visit for me to discuss her fungal culture results sometime on next week you can put her at 5 PM at the end of my clinic day Thanks Dr. Marylene Land

## 2020-07-07 ENCOUNTER — Ambulatory Visit: Payer: PRIVATE HEALTH INSURANCE | Admitting: Sports Medicine

## 2020-08-02 ENCOUNTER — Telehealth: Payer: Self-pay | Admitting: Sports Medicine

## 2020-08-02 NOTE — Telephone Encounter (Signed)
She needs to get rescheduled for a virtual visit for [place] to discuss the results and talk about treatment. She originally scheduled for 5/5. Please get her rescheduled. Thanks Dr. Kathie Rhodes

## 2020-08-02 NOTE — Telephone Encounter (Signed)
Ok, working on it now

## 2020-08-02 NOTE — Telephone Encounter (Signed)
Patient stated she is wanting an antibiotic prescribed for the toenail fungus, the insurance will not cover the cost of the ingrown removal. She recently received bill in mail and that's why she is discouraged on returning because she currently has outstanding bill, Please Advise

## 2020-08-02 NOTE — Telephone Encounter (Signed)
Patient called in stating she never received treatment for the fungal infection. Patient stated she saw results came in from [facility] but doesn't quite understand and wasn't given any instructions for self care, Please Advise

## 2020-08-02 NOTE — Telephone Encounter (Signed)
An antibiotic is not appropriate for nail fungus. We typically prescribe an antifungal. The virtual visit will be at no charge for me to discuss options with her before I can prescribe anything. If we prescribe any medications patient has to be counseled on any risks of the medication and instructed on proper use.   -Dr. Marylene Land

## 2020-08-04 ENCOUNTER — Telehealth: Payer: Self-pay | Admitting: Sports Medicine

## 2020-08-04 NOTE — Telephone Encounter (Signed)
Ok great. Thanks!

## 2020-08-04 NOTE — Telephone Encounter (Signed)
The following patient has been set up for virtual appointment 08/17/20 at 430pm per your request. Patient was informed to be prepared prior to 430 for the appointment

## 2020-08-17 ENCOUNTER — Telehealth (INDEPENDENT_AMBULATORY_CARE_PROVIDER_SITE_OTHER): Payer: PRIVATE HEALTH INSURANCE | Admitting: Sports Medicine

## 2020-08-17 ENCOUNTER — Other Ambulatory Visit: Payer: Self-pay

## 2020-08-17 ENCOUNTER — Encounter: Payer: Self-pay | Admitting: Sports Medicine

## 2020-08-17 DIAGNOSIS — B351 Tinea unguium: Secondary | ICD-10-CM

## 2020-08-17 DIAGNOSIS — M79674 Pain in right toe(s): Secondary | ICD-10-CM

## 2020-08-17 DIAGNOSIS — L608 Other nail disorders: Secondary | ICD-10-CM

## 2020-08-17 NOTE — Progress Notes (Signed)
Virtual Visit via Telephone Note  I connected with Kara Braun on 08/17/20 at  4:30 PM EDT by telephone and verified that I am speaking with the correct person using two identifiers.  Location: Patient: Kara Braun (Home)   Provider: Landis Martins, DPM ([place])    I discussed the limitations, risks, security and privacy concerns of performing an evaluation and management service by telephone and the availability of in person appointments. I also discussed with the patient that there may be a patient responsible charge related to this service. The patient expressed understanding and agreed to proceed.   History of Present Illness: 60 year old female patient met over telephone visit for review of fungal culture results.  Patient denies any changes since last physical encounter.  No pedal complaints at this time.   Observations/Objective: Physical exam unable to be performed due to telephone nature of visit.  Assessment and Plan: Problem List Items Addressed This Visit   None   Visit Diagnoses    Nail fungus    -  Primary   Pincer nail deformity       Toe pain, right         Discussed with patient fungal culture results which are positive for fungus Discussed treatment options of oral Lamisil versus laser versus topical Patient elects at this time to think about her options and reports that likely she will consider oral Lamisil or laser and wants to discuss this with her husband and will call me back or either send a MyChart message to let me know what she has decided  Follow Up Instructions: As directed   I discussed the assessment and treatment plan with the patient. The patient was provided an opportunity to ask questions and all were answered. The patient agreed with the plan and demonstrated an understanding of the instructions.   The patient was advised to call back or seek an in-person evaluation if the symptoms worsen or if the condition fails to improve  as anticipated.  I provided 11 minutes of non-face-to-face time during this encounter.   Landis Martins, DPM

## 2020-08-23 ENCOUNTER — Encounter: Payer: Self-pay | Admitting: Sports Medicine

## 2020-08-23 DIAGNOSIS — B351 Tinea unguium: Secondary | ICD-10-CM

## 2020-08-25 ENCOUNTER — Telehealth: Payer: Self-pay

## 2020-08-25 ENCOUNTER — Ambulatory Visit: Payer: PRIVATE HEALTH INSURANCE | Admitting: Sports Medicine

## 2020-08-25 NOTE — Telephone Encounter (Signed)
Sent LFTs order form to patient's email at [email or web address]. Patient is aware that she needs blood work before getting lamisil

## 2020-08-26 ENCOUNTER — Telehealth: Payer: Self-pay

## 2020-08-26 LAB — HEPATIC FUNCTION PANEL
ALT: 37 IU/L — ABNORMAL HIGH (ref 0–32)
AST: 26 IU/L (ref 0–40)
Albumin: 4.1 g/dL (ref 3.8–4.9)
Alkaline Phosphatase: 123 IU/L — ABNORMAL HIGH (ref 44–121)
Bilirubin Total: 0.2 mg/dL (ref 0.0–1.2)
Bilirubin, Direct: <0.1 mg/dL (ref 0.00–0.40)
Total Protein: 7.2 g/dL (ref 6.0–8.5)

## 2020-08-26 NOTE — Telephone Encounter (Signed)
Contacted pt and advised her her blood work results and Dr. Wynema Birch recommendation to F/U with PCP due to elevated hepatic panel

## 2022-02-01 ENCOUNTER — Ambulatory Visit
Admission: RE | Admit: 2022-02-01 | Discharge: 2022-02-01 | Disposition: A | Discharge status: Home / Self Care | Payer: PRIVATE HEALTH INSURANCE | Source: Ambulatory Visit | Attending: Family Medicine | Admitting: Family Medicine

## 2022-02-01 ENCOUNTER — Other Ambulatory Visit: Payer: Self-pay | Admitting: Family Medicine

## 2022-02-01 DIAGNOSIS — M79604 Pain in right leg: Secondary | ICD-10-CM

## 2022-06-25 ENCOUNTER — Ambulatory Visit
Admission: RE | Admit: 2022-06-25 | Discharge: 2022-06-25 | Disposition: A | Discharge status: Home / Self Care | Payer: PRIVATE HEALTH INSURANCE | Source: Ambulatory Visit | Attending: Family Medicine | Admitting: Family Medicine

## 2022-06-25 ENCOUNTER — Other Ambulatory Visit: Payer: Self-pay | Admitting: Family Medicine

## 2022-06-25 DIAGNOSIS — R059 Cough, unspecified: Secondary | ICD-10-CM
